# Patient Record
Sex: Male | Born: 1955 | Race: Black or African American | Hispanic: No | Marital: Married | Smoking: Never smoker
Health system: Southern US, Community
[De-identification: ages and names within clinical notes are randomized; demographics above are authoritative.]

## PROBLEM LIST (undated history)

## (undated) DIAGNOSIS — K529 Noninfective gastroenteritis and colitis, unspecified: Secondary | ICD-10-CM

## (undated) HISTORY — PX: CYSTECTOMY: SUR359

## (undated) HISTORY — DX: Noninfective gastroenteritis and colitis, unspecified: K52.9

---

## 2005-12-15 ENCOUNTER — Ambulatory Visit: Payer: Self-pay | Admitting: Family Medicine

## 2005-12-15 LAB — CONVERTED CEMR LAB
Glucose, Bld: 91 mg/dL (ref 70–99)
HCT: 46.3 % (ref 39.0–52.0)
MCV: 83.5 fL (ref 78.0–100.0)
RDW: 13.3 % (ref 11.5–14.6)
Triglyceride fasting, serum: 46 mg/dL (ref 0–149)
VLDL: 9 mg/dL (ref 0–40)
WBC: 5.1 10*3/uL (ref 4.5–10.5)

## 2006-01-04 ENCOUNTER — Ambulatory Visit: Payer: Self-pay | Admitting: Gastroenterology

## 2006-01-19 ENCOUNTER — Encounter (INDEPENDENT_AMBULATORY_CARE_PROVIDER_SITE_OTHER): Payer: Self-pay | Admitting: *Deleted

## 2006-01-19 ENCOUNTER — Ambulatory Visit: Payer: Self-pay | Admitting: Gastroenterology

## 2006-02-16 ENCOUNTER — Ambulatory Visit: Payer: Self-pay | Admitting: Gastroenterology

## 2007-03-28 ENCOUNTER — Ambulatory Visit: Payer: Self-pay | Admitting: Family Medicine

## 2007-03-28 DIAGNOSIS — I10 Essential (primary) hypertension: Secondary | ICD-10-CM | POA: Insufficient documentation

## 2007-03-28 DIAGNOSIS — K5190 Ulcerative colitis, unspecified, without complications: Secondary | ICD-10-CM | POA: Insufficient documentation

## 2007-03-28 DIAGNOSIS — K5289 Other specified noninfective gastroenteritis and colitis: Secondary | ICD-10-CM | POA: Insufficient documentation

## 2007-03-29 ENCOUNTER — Encounter (INDEPENDENT_AMBULATORY_CARE_PROVIDER_SITE_OTHER): Payer: Self-pay | Admitting: *Deleted

## 2007-03-29 LAB — CONVERTED CEMR LAB
Cholesterol: 159 mg/dL (ref 0–200)
HDL: 49.5 mg/dL (ref >39.0)
LDL Cholesterol: 100 mg/dL — ABNORMAL HIGH (ref 0–99)
Triglycerides: 46 mg/dL (ref 0–149)

## 2010-05-22 ENCOUNTER — Encounter: Payer: Self-pay | Admitting: Internal Medicine

## 2010-05-23 ENCOUNTER — Ambulatory Visit (INDEPENDENT_AMBULATORY_CARE_PROVIDER_SITE_OTHER): Payer: BC Managed Care – PPO | Admitting: Internal Medicine

## 2010-05-23 ENCOUNTER — Encounter: Payer: Self-pay | Admitting: Internal Medicine

## 2010-05-23 VITALS — BP 122/86 | HR 50 | Ht 72.5 in | Wt 300.8 lb

## 2010-05-23 DIAGNOSIS — H409 Unspecified glaucoma: Secondary | ICD-10-CM

## 2010-05-23 DIAGNOSIS — Z0000 Encounter for general adult medical examination without abnormal findings: Secondary | ICD-10-CM

## 2010-05-23 DIAGNOSIS — K5289 Other specified noninfective gastroenteritis and colitis: Secondary | ICD-10-CM

## 2010-05-23 DIAGNOSIS — Z136 Encounter for screening for cardiovascular disorders: Secondary | ICD-10-CM

## 2010-05-23 DIAGNOSIS — K529 Noninfective gastroenteritis and colitis, unspecified: Secondary | ICD-10-CM

## 2010-05-23 LAB — BASIC METABOLIC PANEL
BUN: 10 mg/dL (ref 6–23)
CO2: 28 mEq/L (ref 19–32)
Calcium: 9.1 mg/dL (ref 8.4–10.5)
Creatinine, Ser: 0.9 mg/dL (ref 0.4–1.5)
Glucose, Bld: 75 mg/dL (ref 70–99)

## 2010-05-23 LAB — CBC WITH DIFFERENTIAL/PLATELET
Basophils Absolute: 0 10*3/uL (ref 0.0–0.1)
Eosinophils Absolute: 0.4 10*3/uL (ref 0.0–0.7)
HCT: 44 % (ref 39.0–52.0)
Hemoglobin: 14.4 g/dL (ref 13.0–17.0)
Lymphs Abs: 2.7 10*3/uL (ref 0.7–4.0)
MCHC: 32.8 g/dL (ref 30.0–36.0)
Monocytes Absolute: 0.5 10*3/uL (ref 0.1–1.0)
Neutro Abs: 3.5 10*3/uL (ref 1.4–7.7)
RDW: 14.9 % — ABNORMAL HIGH (ref 11.5–14.6)

## 2010-05-23 LAB — AST: AST: 23 U/L (ref 0–37)

## 2010-05-23 LAB — LIPID PANEL
Cholesterol: 139 mg/dL (ref 0–200)
Triglycerides: 34 mg/dL (ref 0.0–149.0)

## 2010-05-23 LAB — ALT: ALT: 21 U/L (ref 0–53)

## 2010-05-23 NOTE — Progress Notes (Signed)
  Subjective:    Patient ID: Mitchell Thornton, male    DOB: 14-Dec-1955, 55 y.o.   MRN: [id number]  HPI NEW PT, CPX Doing well Past Medical History  Diagnosis Date  . Colitis     Cscope ~ 2007, L sided, Rx lialda  . Glaucoma    Past Surgical History  Procedure Date  . Cystectomy     from leg    Family History  Problem Relation Age of Onset  . Coronary artery disease Neg Hx   . Diabetes      M  . Stroke Neg Hx   . Deep vein thrombosis      "clots" mother   . Hypertension      M  . Prostate cancer  40    F  . Colon cancer Neg Hx     History   Social History  . Marital Status: Married    Spouse Name: N/A    Number of Children: 3  . 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 Smoking status: Never Smoker   . Smokeless tobacco: Not on file  . Alcohol Use: No  . Drug Use: No  . Sexually Active: Not on file   Other Topics Concern  . Not on file   Social History Narrative   Original from [place].Marland KitchenMarland KitchenMarland KitchenRegular Exercise- yes, treadmil .... Trying to watch diet...     Review of Systems  Constitutional: Negative for fever and unexpected weight change.  Respiratory: Negative for cough and shortness of breath.   Cardiovascular: Negative for chest pain, palpitations and leg swelling.  Gastrointestinal: Negative for nausea, vomiting, abdominal pain, diarrhea and blood in stool.  Genitourinary: Negative for dysuria, urgency, hematuria and difficulty urinating.       Objective:   Physical Exam  Constitutional: He appears well-developed and well-nourished. No distress.  HENT:  Head: Normocephalic and atraumatic.  Eyes: No scleral icterus.  Neck: Normal range of motion. Neck supple. No thyromegaly present.       Carotid pulses normal  Cardiovascular: Normal rate, regular rhythm and normal heart sounds.   No murmur heard. Pulmonary/Chest: Effort normal and breath sounds normal. No respiratory distress.  He has no wheezes. He has no rales.  Abdominal: Soft. Bowel sounds are normal. He exhibits no distension. There is no tenderness. There is no rebound and no guarding.  Genitourinary:       Has a small external hemorrhoid with no bleeding. Digital rectal exam is normal. Very hard to reach his prostate due to patient habitus. Prostate however seems normal. No stools found.          Assessment & Plan:

## 2010-05-23 NOTE — Assessment & Plan Note (Addendum)
According to the computer, last colonoscopy was in 2009, I can't find a report. He is essentially asymptomatic. I will send a message to GI asking them when his next followup colonoscopy is due .

## 2010-05-24 NOTE — Assessment & Plan Note (Addendum)
Reports h/o glaucoma, uses tetrahydrozoline? Will clarify w/ pharmacy

## 2010-05-24 NOTE — Assessment & Plan Note (Signed)
UTD on TD Diet, exercise discussed Labs  EKG sinus brady, asx , observation

## 2010-05-26 ENCOUNTER — Telehealth: Payer: Self-pay | Admitting: *Deleted

## 2010-05-26 NOTE — Telephone Encounter (Signed)
Message copied by Army Fossa on Mon May 26, 2010  4:06 PM ------      Message from: Willow Ora      Created: Sat May 24, 2010  5:37 PM       Call his pharmacy, does he use tetrahydrozoline eye drops ? (that is not for glaucoma)      Is he allergic to anything?

## 2010-05-26 NOTE — Telephone Encounter (Signed)
I updated med list with correct eye drop that the pharmacist gave me. They also do not have any Allergies on file.

## 2010-05-27 ENCOUNTER — Telehealth: Payer: Self-pay | Admitting: *Deleted

## 2010-05-27 NOTE — Telephone Encounter (Signed)
Message left for patient to return my call.  

## 2010-05-27 NOTE — Telephone Encounter (Signed)
Message copied by Army Fossa on Tue May 27, 2010  9:48 AM ------      Message from: Mitchell Thornton      Created: Mon May 26, 2010  5:17 PM       Advise patient      His cholesterol is very good      His liver, thyroid and PSA test are all normal      Very good results

## 2010-05-28 NOTE — Telephone Encounter (Signed)
Message left for patient to return my call.  

## 2010-05-30 ENCOUNTER — Encounter: Payer: Self-pay | Admitting: Gastroenterology

## 2010-05-30 ENCOUNTER — Telehealth: Payer: Self-pay | Admitting: *Deleted

## 2010-05-30 NOTE — Telephone Encounter (Signed)
Message left for patient to return my call.  

## 2010-05-30 NOTE — Telephone Encounter (Signed)
Consult for Colonoscopy per Dr Arlyce Dice

## 2010-06-02 ENCOUNTER — Encounter: Payer: Self-pay | Admitting: *Deleted

## 2010-06-02 NOTE — Telephone Encounter (Signed)
Mailed letter to pt

## 2010-06-20 NOTE — Assessment & Plan Note (Signed)
Mitchell Thornton                         GASTROENTEROLOGY OFFICE NOTE   Mitchell Thornton, Mitchell Thornton                      MRN:          [id number]  DATE:01/04/2006                            DOB:          28-May-1955    PROBLEM:  Rectal bleeding.   Mitchell Thornton is a pleasant 55 year old male referred through the  courtesy of Dr. Blossom Hoops for evaluation.  For years, he has noted  intermittent rectal bleeding, consisting of bright red blood on the  toilet tissue.  This is associated with constipation and rectal  discomfort, which he attributes to hemorrhoids.  He tends to be  constipated.  Sigmoidoscopy was done a couple of years ago because of  rectal bleeding, and apparently hemorrhoids only were seen.   PAST MEDICAL HISTORY:  Unremarkable.   FAMILY HISTORY:  Pertinent for prostate cancer.   He is on no medications.  He has no allergies.   He neither smokes nor drinks.  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   REVIEW OF SYSTEMS:  Reviewed and was negative.   EXAM:  Pulse 70, blood pressure 138/70, weight 290.  HEENT:  EOMI. PERRLA. Sclerae are anicteric.  Conjunctivae are pink.  NECK:  Supple without thyromegaly, adenopathy or carotid bruits.  CHEST:  Clear to auscultation and percussion without adventitious  sounds.  CARDIAC:  Regular rhythm; normal S1 S2.  There are no murmurs, gallops  or rubs.  ABDOMEN:  Bowel sounds are normoactive.  Abdomen is soft, non-tender and  non-distended.  There are no abdominal masses, tenderness, splenic  enlargement or hepatomegaly.  EXTREMITIES:  Full range of motion.  No cyanosis, clubbing or edema.  RECTAL:  He has small rectal skin tags.  Hemoccult not done.  There are  no masses.  Stool is Hemoccult negative.   IMPRESSION:  1. Limited rectal bleeding - probably secondary to hemorrhoids.  2. Idiopathic constipation.   RECOMMENDATIONS:  1. Fiber supplementation.  2. Screening colonoscopy.  3. Medical  therapy for hemorrhoids if symptoms persist despite fiber.     Barbette Hair. Arlyce Dice, MD,FACG  Electronically Signed    RDK/MedQ  DD: 01/04/2006  DT: 01/04/2006  Job #: [id number]   cc:   Leanne Chang, M.D.

## 2010-06-20 NOTE — Letter (Signed)
January 04, 2006    Dwyane Dupree  [address]  [place], [place]  [address]   RE:  STANLEE, ROEHRIG  MRN:  [id number]  /  DOB:  [birth year]   Dear Mr. Aundria Rud:   It is my pleasure to have treated you recently as a new patient in my  office.  I appreciate your confidence and the opportunity to participate  in your care.   Since I do have a busy inpatient endoscopy schedule and office schedule,  my office hours vary weekly.  I am, however, available for emergency  calls every day through my office.  If I cannot promptly meet an urgent  office appointment, another one of our gastroenterologists will be able  to assist you.   My well-trained staff are prepared to help you at all times.  For  emergencies after office hours, a physician from our gastroenterology  section is always available through my 24-hour answering service.   While you are under my care, I encourage discussion of your questions  and concerns, and I will be happy to return your calls as soon as I am  available.   Once again, I welcome you as a new patient and I look forward to a happy  and healthy relationship.    Sincerely,      Barbette Hair. Arlyce Dice, MD,FACG  Electronically Signed    RDK/MedQ  DD: 01/04/2006  DT: 01/04/2006  Job #: [id number]

## 2010-06-20 NOTE — Assessment & Plan Note (Signed)
[facility]                         GASTROENTEROLOGY OFFICE NOTE   SAVIEN, MAMULA                      MRN:          [id number]  DATE:02/16/2006                            DOB:          1955/02/26    PROBLEM:  Left sided colitis is the reason Mr. Navarette has returned  for scheduled gastrointestinal follow up.  Left sided colitis was noted  at colonoscopy indicated for rectal bleeding.  Since starting Lialda he  has had no further bleeding.  He is without diarrhea.   On exam:  Pulse 60, blood pressure 120/60, weight 301.   IMPRESSION:  Left sided colitis status in remission.   RECOMMENDATIONS:  The patient will consider enrollment in a Insulin  Potentiation Therapy trial.  Feeling that he will be maintained on  Lialda 2.4 grams a day.     Barbette Hair. Arlyce Dice, MD,FACG  Electronically Signed    RDK/MedQ  DD: 02/16/2006  DT: 02/16/2006  Job #: [id number]

## 2010-06-20 NOTE — Letter (Signed)
January 04, 2006    Leanne Chang, M.D.  [facility]  [place], [place]  [address]   RE:  JERONE, CUDMORE  MRN:  [id number]  /  DOB:  1956/01/01   Dear Dr. Blossom Hoops:   Upon your kind referral, I had the pleasure of evaluating your patient  and I am pleased to offer my findings.  I saw Lakyn Mantione in the  office today.  Enclosed is a copy of my progress note that details my  findings and recommendations.   Thank you for the opportunity to participate in your patient's care.    Sincerely,      Barbette Hair. Arlyce Dice, MD,FACG  Electronically Signed    RDK/MedQ  DD: 01/04/2006  DT: 01/04/2006  Job #: [id number]

## 2010-06-26 ENCOUNTER — Ambulatory Visit: Payer: BC Managed Care – PPO | Admitting: Gastroenterology

## 2012-05-31 ENCOUNTER — Ambulatory Visit (INDEPENDENT_AMBULATORY_CARE_PROVIDER_SITE_OTHER): Payer: BC Managed Care – PPO | Admitting: Internal Medicine

## 2012-05-31 ENCOUNTER — Encounter: Payer: Self-pay | Admitting: Internal Medicine

## 2012-05-31 VITALS — BP 140/80 | HR 56 | Temp 98.2°F | Ht 72.0 in | Wt 304.0 lb

## 2012-05-31 DIAGNOSIS — Z0000 Encounter for general adult medical examination without abnormal findings: Secondary | ICD-10-CM

## 2012-05-31 LAB — CBC WITH DIFFERENTIAL/PLATELET
Basophils Relative: 0.6 % (ref 0.0–3.0)
Eosinophils Absolute: 0.2 10*3/uL (ref 0.0–0.7)
HCT: 43.9 % (ref 39.0–52.0)
Hemoglobin: 14.5 g/dL (ref 13.0–17.0)
Lymphocytes Relative: 34.7 % (ref 12.0–46.0)
Lymphs Abs: 2.9 10*3/uL (ref 0.7–4.0)
MCHC: 33 g/dL (ref 30.0–36.0)
Neutro Abs: 4.5 10*3/uL (ref 1.4–7.7)
RBC: 5.51 Mil/uL (ref 4.22–5.81)

## 2012-05-31 LAB — COMPREHENSIVE METABOLIC PANEL
AST: 25 U/L (ref 0–37)
Alkaline Phosphatase: 85 U/L (ref 39–117)
BUN: 14 mg/dL (ref 6–23)
Calcium: 9 mg/dL (ref 8.4–10.5)
Chloride: 105 mEq/L (ref 96–112)
Creatinine, Ser: 0.9 mg/dL (ref 0.4–1.5)
Total Bilirubin: 0.7 mg/dL (ref 0.3–1.2)

## 2012-05-31 LAB — PSA: PSA: 0.82 ng/mL (ref 0.10–4.00)

## 2012-05-31 LAB — LIPID PANEL: VLDL: 7.6 mg/dL (ref 0.0–40.0)

## 2012-05-31 NOTE — Progress Notes (Signed)
  Subjective:    Patient ID: Mitchell Thornton, male    DOB: [birth year], 57 y.o.   MRN: [id number]  HPI  CPX  Past Medical History  Diagnosis Date  . Colitis     Cscope ~ 2007, L sided, Rx lialda  . Glaucoma(365)    Past Surgical History  Procedure Laterality Date  . Cystectomy      from leg    History   Social History  . Marital Status: Married    Spouse Name: N/A    Number of Children: 3  . 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 Smoking status: Never Smoker   . Smokeless tobacco: Never Used  . Alcohol Use: No  . Drug Use: No  . Sexually Active: Not on file   Other Topics Concern  . Not on file   Social History Narrative   Original from [place], moved to the [place] in 1982, moved to [place] ~ 1995                  Family History  Problem Relation Age of Onset  . Coronary artery disease Neg Hx   . Diabetes Mother   . Stroke Neg Hx   . Deep vein thrombosis Mother     "clots" mother   . Hypertension Mother   . Prostate cancer Father 76  . Colon cancer Neg Hx   . Aneurysm Father     brain aneurysm    Review of Systems  In general feeling well. No chest pain, shortness or breath. no nausea, vomiting, diarrhea or blood in the stools. No dysuria, blood in the urine or difficulty urinating. No anxiety depression. He admits to snoring, denies feeling sleepy, in the mornings he feels well rested.    Objective:   Physical Exam BP 140/80  Pulse 56  Temp(Src) 98.2 F (36.8 C) (Oral)  Ht 6' (1.829 m)  Wt 304 lb (137.893 kg)  BMI 41.22 kg/m2  SpO2 99%  General -- alert, well-developed, No apparent distress.   Neck --no thyromegaly Lungs -- normal respiratory effort, no intercostal retractions, no accessory muscle use, and normal breath sounds.   Heart-- normal rate, regular rhythm, no murmur, and no gallop.   Abdomen--soft, non-tender, no distention, no masses, no HSM, no guarding, and no  rigidity.   Extremities-- no pretibial edema bilaterally  DRE: No external abnormalities, no mass, prostate very hard to reach however does not seem to be tender indurated or nodular. no stools found. Neurologic-- alert & oriented X3 and strength normal in all extremities. Psych-- Cognition and judgment appear intact. Alert and cooperative with normal attention span and concentration.  not anxious appearing and not depressed appearing.       Assessment & Plan:

## 2012-05-31 NOTE — Assessment & Plan Note (Signed)
UTD on TD, 2009 Labs  Last Cscope 9-12, next 2017  Diet, exercise -- not doing well lately but plans to make some changes BP 140/80, rec self monitoring He snores but otherwise not tired or sleepy, rec wt loss (BMI 41)

## 2012-05-31 NOTE — Patient Instructions (Addendum)
Check the  blood pressure 2 or 3 times a week, be sure it is between 110/60 and 140/85. If it is consistently higher or lower, let me know  

## 2012-06-01 ENCOUNTER — Encounter: Payer: Self-pay | Admitting: Internal Medicine

## 2012-06-06 ENCOUNTER — Encounter: Payer: Self-pay | Admitting: *Deleted

## 2013-03-13 ENCOUNTER — Ambulatory Visit (INDEPENDENT_AMBULATORY_CARE_PROVIDER_SITE_OTHER): Payer: BC Managed Care – PPO | Admitting: Internal Medicine

## 2013-03-13 ENCOUNTER — Encounter: Payer: Self-pay | Admitting: Internal Medicine

## 2013-03-13 VITALS — BP 171/90 | HR 61 | Temp 98.6°F | Wt 315.0 lb

## 2013-03-13 DIAGNOSIS — N318 Other neuromuscular dysfunction of bladder: Secondary | ICD-10-CM

## 2013-03-13 DIAGNOSIS — M79644 Pain in right finger(s): Secondary | ICD-10-CM

## 2013-03-13 DIAGNOSIS — M79609 Pain in unspecified limb: Secondary | ICD-10-CM

## 2013-03-13 DIAGNOSIS — N3281 Overactive bladder: Secondary | ICD-10-CM

## 2013-03-13 DIAGNOSIS — R350 Frequency of micturition: Secondary | ICD-10-CM

## 2013-03-13 LAB — POCT URINALYSIS DIPSTICK
BILIRUBIN UA: NEGATIVE
GLUCOSE UA: NEGATIVE
Ketones, UA: NEGATIVE
Leukocytes, UA: NEGATIVE
NITRITE UA: NEGATIVE
Protein, UA: NEGATIVE
RBC UA: NEGATIVE
Spec Grav, UA: 1.01
UROBILINOGEN UA: NEGATIVE
pH, UA: 6

## 2013-03-13 NOTE — Patient Instructions (Signed)
Avoid excessive caffeine Drink plenty of clear fluids during the daytime Avoid excessive fluids after 7 PM Call anytime if  symptoms get worse. Next visit in April for a physical exam

## 2013-03-13 NOTE — Progress Notes (Signed)
   Subjective:    Patient ID: Mitchell Thornton, male    DOB: May 29, 1955, 58 y.o.   MRN: [id number]  DOS:  03/13/2013 Acute visit ,we discussed the following issues  For the last 3 years with cold weather he experiences nocturia, needs to urinate 2 or 3 times at night, no symptoms during the summer. Also complained off the right thumb get locked. + pain. No injury that he can recall.   Past Medical History  Diagnosis Date  . Colitis     Cscope ~ 2007, L sided, Rx lialda  . Glaucoma     Past Surgical History  Procedure Laterality Date  . Cystectomy      from leg     History   Social History  . Marital Status: Married    Spouse Name: N/A    Number of Children: 3  . 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 Smoking status: Never Smoker   . Smokeless tobacco: Never Used  . Alcohol Use: No  . Drug Use: No  . Sexual Activity: Not on file   Other Topics Concern  . Not on file   Social History Narrative   Original from [place], moved to the [place] in [place], moved to [place] ~ 1995                    ROS No f/c  No dysuria, gross hematuria, difficulty urinating    no flank or abdominal pain     Objective:   Physical Exam BP 171/90  Pulse 61  Temp(Src) 98.6 F (37 C)  Wt 315 lb (142.883 kg)  SpO2 99% General -- alert, well-developed, NAD.  Abdomen-- Not distended, good bowel sounds,soft, non-tender. No CVA tenderness  Extremities--  All fingers normal to inspection, the right thumb flexion is quite limited, I did passive flexion and the PIP  did move but I felt that click and pt had pain. Neurologic--  alert & oriented X3. Speech normal, gait normal, strength normal in all extremities.   Psych-- Cognition and judgment appear intact. Cooperative with normal attention span and concentration. No anxious or depressed appearing.       Assessment & Plan:   Over active bladder ?, On and off nocturia  for 2 or 3 years only in the winter, DRE and PSA 05/2012 were stable/normal (DRE not repeated today) No red flag symptoms. Udip is negative. Plan:  Urine culture, we discussed also Flomax The patient not interested at this time the symptoms are mild. He will call me if symptoms increase and we can discuss further next April 90 come back for his physical.  R Thumb pain click: Refer to ortho

## 2013-03-13 NOTE — Progress Notes (Signed)
Pre visit review using our clinic review tool, if applicable. No additional management support is needed unless otherwise documented below in the visit note. 

## 2013-03-14 LAB — URINALYSIS, ROUTINE W REFLEX MICROSCOPIC
BILIRUBIN URINE: NEGATIVE
HGB URINE DIPSTICK: NEGATIVE
Ketones, ur: NEGATIVE
Leukocytes, UA: NEGATIVE
NITRITE: NEGATIVE
PH: 6.5 (ref 5.0–8.0)
Specific Gravity, Urine: 1.015 (ref 1.000–1.030)
TOTAL PROTEIN, URINE-UPE24: NEGATIVE
URINE GLUCOSE: NEGATIVE
Urobilinogen, UA: 0.2 (ref 0.0–1.0)
WBC UA: NONE SEEN — AB (ref 0–?)

## 2013-03-14 LAB — URINE CULTURE
Colony Count: NO GROWTH
Organism ID, Bacteria: NO GROWTH

## 2013-06-02 ENCOUNTER — Telehealth: Payer: Self-pay

## 2013-06-02 NOTE — Telephone Encounter (Signed)
Medication List and allergies:  Reviewed and updated  90 day supply/mail order: na [place] [facility] [address] [place]  Immunizations due: flu vaccine in the fall  A/P:   No changes to FH, PSH or Personal Hx Flu vaccine--12/2012 Tdap--2009 [facility]CS--09/2010--[facility]--next due 2017 PSA--05/2012--0.82  To Discuss with Provider: Not at this time

## 2013-06-05 ENCOUNTER — Encounter: Payer: Self-pay | Admitting: Internal Medicine

## 2013-06-05 ENCOUNTER — Ambulatory Visit (INDEPENDENT_AMBULATORY_CARE_PROVIDER_SITE_OTHER): Payer: BC Managed Care – PPO | Admitting: Internal Medicine

## 2013-06-05 VITALS — BP 173/93 | HR 64 | Temp 98.2°F | Ht 72.1 in | Wt 315.0 lb

## 2013-06-05 DIAGNOSIS — Z0000 Encounter for general adult medical examination without abnormal findings: Secondary | ICD-10-CM

## 2013-06-05 DIAGNOSIS — R030 Elevated blood-pressure reading, without diagnosis of hypertension: Secondary | ICD-10-CM

## 2013-06-05 LAB — TSH: TSH: 0.52 u[IU]/mL (ref 0.35–5.50)

## 2013-06-05 LAB — CBC WITH DIFFERENTIAL/PLATELET
BASOS ABS: 0 10*3/uL (ref 0.0–0.1)
BASOS PCT: 0.4 % (ref 0.0–3.0)
EOS ABS: 0.2 10*3/uL (ref 0.0–0.7)
Eosinophils Relative: 2.7 % (ref 0.0–5.0)
HCT: 43.8 % (ref 39.0–52.0)
Hemoglobin: 14.5 g/dL (ref 13.0–17.0)
LYMPHS PCT: 40.9 % (ref 12.0–46.0)
Lymphs Abs: 2.9 10*3/uL (ref 0.7–4.0)
MCHC: 33.2 g/dL (ref 30.0–36.0)
MCV: 80.6 fl (ref 78.0–100.0)
MONO ABS: 0.5 10*3/uL (ref 0.1–1.0)
Monocytes Relative: 6.6 % (ref 3.0–12.0)
NEUTROS PCT: 49.4 % (ref 43.0–77.0)
Neutro Abs: 3.6 10*3/uL (ref 1.4–7.7)
PLATELETS: 170 10*3/uL (ref 150.0–400.0)
RBC: 5.43 Mil/uL (ref 4.22–5.81)
RDW: 14.6 % (ref 11.5–14.6)
WBC: 7.2 10*3/uL (ref 4.5–10.5)

## 2013-06-05 LAB — COMPREHENSIVE METABOLIC PANEL
ALBUMIN: 3.9 g/dL (ref 3.5–5.2)
ALK PHOS: 70 U/L (ref 39–117)
ALT: 30 U/L (ref 0–53)
AST: 28 U/L (ref 0–37)
BUN: 17 mg/dL (ref 6–23)
CALCIUM: 8.7 mg/dL (ref 8.4–10.5)
CHLORIDE: 105 meq/L (ref 96–112)
CO2: 26 mEq/L (ref 19–32)
Creatinine, Ser: 1 mg/dL (ref 0.4–1.5)
GFR: 101.72 mL/min (ref >60.00)
Glucose, Bld: 98 mg/dL (ref 70–99)
POTASSIUM: 4 meq/L (ref 3.5–5.1)
SODIUM: 137 meq/L (ref 135–145)
TOTAL PROTEIN: 7.4 g/dL (ref 6.0–8.3)
Total Bilirubin: 0.3 mg/dL (ref 0.3–1.2)

## 2013-06-05 LAB — LIPID PANEL
CHOLESTEROL: 140 mg/dL (ref 0–200)
HDL: 41.6 mg/dL (ref >39.00)
LDL CALC: 89 mg/dL (ref 0–99)
TRIGLYCERIDES: 46 mg/dL (ref 0.0–149.0)
Total CHOL/HDL Ratio: 3
VLDL: 9.2 mg/dL (ref 0.0–40.0)

## 2013-06-05 MED ORDER — AMLODIPINE BESYLATE 5 MG PO TABS: 5.0000 mg | ORAL_TABLET | Freq: Every day | ORAL | Status: DC

## 2013-06-05 NOTE — Assessment & Plan Note (Addendum)
In addition to diet and exercise will start amlodipine 5 mg, followup 2 months ekg today brachycardia, at baseline

## 2013-06-05 NOTE — Assessment & Plan Note (Addendum)
Td  2009 Labs  Last Cscope 9-12, next 2017  PSA stable x years, DRE and PSA next year Diet, exercise: Extensive discussion, rec calorie counting and exercise goal of 3 hours a week He snores, occ feels  tired   rec wt loss (BMI 41), consider sleep study

## 2013-06-05 NOTE — Patient Instructions (Signed)
Get your blood work before you leave   Start amlodipine 5 mg one tablet daily Check the  blood pressure 2 or 3 times a  week be sure it is between 110/60 and 140/85. Ideal blood pressure is 120/80. If it is consistently higher or lower, let me know  Exercise 3 hours a week, walking in the treadmill is great Start counting your daily calories intake, may like to use MYFITNESSPAL   Next visit is for routine check up regards your blood pressure and weight in 6-8 weeks. No need to come back fasting Please make an appointment

## 2013-06-05 NOTE — Progress Notes (Signed)
   Subjective:    Patient ID: Mitchell RilesLouis Dakin, male    DOB: August 09, 1955, 58 y.o.   MRN: [id number]  DOS:  06/05/2013 Type of  visit:  CPX Concerned about his weight, he tends brought a treadmill and uses it on and off. He usually has a very light breakfast and then overeats at night. Admits to snoring, from time to time he gets fatigued throughout the day.  ROS   No  CP, SOB No palpitations, no lower extremity edema Denies  nausea, vomiting diarrhea No abdominal pain Denies  blood in the stools  (-) cough, sputum production (-) wheezing, chest congestion  some allergies  No dysuria, gross hematuria, difficulty urinating  No anxiety, depression    Past Medical History  Diagnosis Date  . Colitis     Cscope ~ 2007, L sided, Rx lialda  . Glaucoma     Past Surgical History  Procedure Laterality Date  . Cystectomy      from leg     History   Social History  . Marital Status: Married    Spouse Name: N/A    Number of Children: 3  . 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 Smoking status: Never Smoker   . Smokeless tobacco: Never Used  . Alcohol Use: No  . Drug Use: No  . Sexual Activity: Not on file   Other Topics Concern  . Not on file   Social History Narrative   Original from [place], [place] the [place], moved to [place] ~ 1995                       Medication List       This list is accurate as of: 06/05/13  8:43 AM.  Always use your most recent med list.               latanoprost 0.005 % ophthalmic solution  Commonly known as:  XALATAN  Place 1 drop into both eyes at bedtime.     mesalamine 1.2 G EC tablet  Commonly known as:  LIALDA  Take 2 tablet by mouth every morning.           Objective:   Physical Exam BP 173/93  Pulse 64  Temp(Src) 98.2 F (36.8 C)  Ht 6' 0.1" (1.831 m)  Wt 315 lb (142.883 kg)  BMI 42.62 kg/m2  SpO2 100%  General -- alert,  well-developed, NAD.  Neck --no thyromegaly    Lungs -- normal respiratory effort, no intercostal retractions, no accessory muscle use, and normal breath sounds.  Heart-- normal rate, regular rhythm, no murmur.  Abdomen-- Not distended, good bowel sounds,soft, non-tender.  Extremities-- no pretibial edema bilaterally  Neurologic--  alert & oriented X3. Speech normal, gait normal, strength normal in all extremities.  Psych-- Cognition and judgment appear intact. Cooperative with normal attention span and concentration. No anxious or depressed appearing.         Assessment & Plan:

## 2013-06-05 NOTE — Progress Notes (Signed)
Pre visit review using our clinic review tool, if applicable. No additional management support is needed unless otherwise documented below in the visit note. 

## 2013-06-07 ENCOUNTER — Encounter: Payer: Self-pay | Admitting: *Deleted

## 2013-07-27 ENCOUNTER — Ambulatory Visit: Payer: BC Managed Care – PPO | Admitting: Internal Medicine

## 2013-09-26 ENCOUNTER — Encounter: Payer: Self-pay | Admitting: Medical

## 2013-09-26 ENCOUNTER — Ambulatory Visit (INDEPENDENT_AMBULATORY_CARE_PROVIDER_SITE_OTHER): Payer: BC Managed Care – PPO | Admitting: Medical

## 2013-09-26 VITALS — BP 140/80 | HR 76 | Temp 99.0°F | Wt 300.0 lb

## 2013-09-26 DIAGNOSIS — R197 Diarrhea, unspecified: Secondary | ICD-10-CM

## 2013-09-26 DIAGNOSIS — R109 Unspecified abdominal pain: Secondary | ICD-10-CM | POA: Insufficient documentation

## 2013-09-26 DIAGNOSIS — K5732 Diverticulitis of large intestine without perforation or abscess without bleeding: Secondary | ICD-10-CM

## 2013-09-26 MED ORDER — METRONIDAZOLE 500 MG PO TABS: 500.0000 mg | ORAL_TABLET | Freq: Three times a day (TID) | ORAL | Status: DC

## 2013-09-26 MED ORDER — CIPROFLOXACIN HCL 500 MG PO TABS: 500.0000 mg | ORAL_TABLET | Freq: Two times a day (BID) | ORAL | Status: DC

## 2013-09-26 NOTE — Progress Notes (Signed)
Pre visit review using our clinic review tool, if applicable. No additional management support is needed unless otherwise documented below in the visit note. 

## 2013-09-26 NOTE — Progress Notes (Signed)
   Subjective:    Patient ID: Mitchell Thornton, male    DOB: [birth year], 58 y.o.   MRN: [id number]  HPI   Pt in with feeling bloated and passing gas. Pt last 3 days loose stools and gas. Pt states about 5 loose stools per day past 2 days. Pt had known diverticulosis and one time took antibiotics for diverticultis, He did see some bright red blood in his stool on Saturday. Sunday felt some chills. No fever. Pt states bright red stool occurred after five very water diarrhea. Patient thinks that the diarrhea may his rectum a little raw. The causing bright red blood.  No family members sick with diarrhea. No association with anything he ate.   Review of Systems  Constitutional: Positive for chills. Negative for fever and fatigue.       Chills on Sunday  Respiratory: Negative for cough, choking and wheezing.   Cardiovascular: Negative for chest pain and palpitations.  Gastrointestinal: Positive for abdominal pain, diarrhea, blood in stool and abdominal distention. Negative for nausea, vomiting, constipation, anal bleeding and rectal pain.       One stool that appeared to have some bright red blood. This was after 5 watery stools that he thinks irritated his rectum. No blood seen since.  Genitourinary: Negative.   Musculoskeletal: Negative for back pain, gait problem, myalgias, neck pain and neck stiffness.  Neurological: Negative.        Objective:   Physical Exam  General Appearance- Not in acute distress.  HEENT Eyes- Scleraeral/Conjuntiva-bilat- Not Yellow. Mouth & Throat- Normal.  Chest and Lung Exam Auscultation: Breath sounds:-Normal. Adventitious sounds:- No Adventitious sounds.  Cardiovascular Auscultation:Rythm - Regular. Heart Sounds -Normal heart sounds.  Abdomen Inspection:-Inspection Normal.  Palpation/Perucssion: Palpation- Non Tender in all quadrants(particularly the left lower quadrant), No Rebound tenderness, No rigidity(Guarding) and No Palpable abdominal  masses.  Liver:-Normal.  Spleen:- Normal.   Rectal Anorectal Exam: Stool - Hemoccult of stool/mucous is Heme Negative. External - normal external exam. Internal - normal sphincter tone. No rectal mass.        Assessment & Plan:

## 2013-09-26 NOTE — Assessment & Plan Note (Signed)
We'll go ahead and prescribe ciprofloxacin and Flagyl for 10 days. Patient instructed to do stool panel studies tonight and turned them in tomorrow a.m. There start antibiotics.

## 2013-09-26 NOTE — Assessment & Plan Note (Signed)
Likely associated with the diverticulitis but I do want him to do a stool panel prior to starting antibiotics tomorrow.

## 2013-09-26 NOTE — Assessment & Plan Note (Signed)
Some recent leg but on exam today he had no pain. I did advise him that if his abdominal pain recurs with any blood then to notify us since in that case I would likely order an abdomen/pelvis CT. Any severe pain after hours did advise patient to go to the emergency department.

## 2013-09-26 NOTE — Patient Instructions (Signed)
For your diarrhea for 3 days I want you to turn in stool panel kit by tomorrow am. Then start antibiotics(cipro and flagyl) I sent to your pharmacy for possible diverticulitis. Get cbc/lab today. If your abdominal pain worsens or blood stools notify me and would order ct of abdomen/pelvis. Follow up in 7 days or as needed. Any severe abdomen pain after hours then ED evaluation.

## 2013-09-27 LAB — CBC WITH DIFFERENTIAL/PLATELET
BASOS ABS: 0 10*3/uL (ref 0.0–0.1)
Basophils Relative: 0.2 % (ref 0.0–3.0)
EOS PCT: 5.1 % — AB (ref 0.0–5.0)
Eosinophils Absolute: 0.5 10*3/uL (ref 0.0–0.7)
HEMATOCRIT: 41.9 % (ref 39.0–52.0)
Hemoglobin: 13.6 g/dL (ref 13.0–17.0)
LYMPHS ABS: 2.5 10*3/uL (ref 0.7–4.0)
LYMPHS PCT: 24.4 % (ref 12.0–46.0)
MCHC: 32.5 g/dL (ref 30.0–36.0)
MCV: 81.4 fl (ref 78.0–100.0)
MONOS PCT: 7.8 % (ref 3.0–12.0)
Monocytes Absolute: 0.8 10*3/uL (ref 0.1–1.0)
Neutro Abs: 6.4 10*3/uL (ref 1.4–7.7)
Neutrophils Relative %: 62.5 % (ref 43.0–77.0)
Platelets: 165 10*3/uL (ref 150.0–400.0)
RBC: 5.15 Mil/uL (ref 4.22–5.81)
RDW: 14.6 % (ref 11.5–15.5)
WBC: 10.3 10*3/uL (ref 4.0–10.5)

## 2013-09-29 LAB — OVA AND PARASITE EXAMINATION: OP: NONE SEEN

## 2013-09-29 LAB — CLOSTRIDIUM DIFFICILE BY PCR: Toxigenic C. Difficile by PCR: NOT DETECTED

## 2013-10-01 LAB — STOOL CULTURE

## 2013-10-03 ENCOUNTER — Telehealth: Payer: Self-pay | Admitting: Medical

## 2013-10-03 ENCOUNTER — Telehealth: Payer: Self-pay

## 2013-10-03 DIAGNOSIS — K5732 Diverticulitis of large intestine without perforation or abscess without bleeding: Secondary | ICD-10-CM

## 2013-10-03 NOTE — Telephone Encounter (Signed)
Message copied by Lurline Hare on Tue Oct 03, 2013  8:38 AM ------      Message from: Gwenevere Abbot      Created: Tue Oct 03, 2013  6:14 AM       Please call pt and see how he is doing. Let him know we are trying to get him in with GI this week. Is he having a lot of blood with stools. Is he feeling real weak. Let me know in that event want to repeat cbc and maybe do imaging studies. Trying to get him in with gi by Thursday or Friday of this week. ------

## 2013-10-03 NOTE — Telephone Encounter (Signed)
Will refer to GI. For probable diverticulitis with some improvement but reported still blood in stools.

## 2013-10-19 ENCOUNTER — Telehealth: Payer: Self-pay | Admitting: Medical

## 2013-10-19 NOTE — Telephone Encounter (Signed)
I called pt to see how he is ? He was put on Lialda for ulcerative colitis. I wanted to see how he was doing and make sure he would follow up with GI. I left message on his machine.

## 2013-12-30 ENCOUNTER — Other Ambulatory Visit: Payer: Self-pay | Admitting: Internal Medicine

## 2014-01-29 ENCOUNTER — Ambulatory Visit (INDEPENDENT_AMBULATORY_CARE_PROVIDER_SITE_OTHER): Payer: BC Managed Care – PPO | Admitting: Internal Medicine

## 2014-01-29 ENCOUNTER — Encounter: Payer: Self-pay | Admitting: Internal Medicine

## 2014-01-29 VITALS — BP 165/81 | HR 73 | Temp 98.4°F | Ht 72.0 in | Wt 311.4 lb

## 2014-01-29 DIAGNOSIS — R109 Unspecified abdominal pain: Secondary | ICD-10-CM

## 2014-01-29 DIAGNOSIS — I10 Essential (primary) hypertension: Secondary | ICD-10-CM

## 2014-01-29 MED ORDER — AMLODIPINE BESYLATE 5 MG PO TABS: 5.0000 mg | ORAL_TABLET | Freq: Every day | ORAL | Status: DC

## 2014-01-29 NOTE — Progress Notes (Signed)
Pre visit review using our clinic review tool, if applicable. No additional management support is needed unless otherwise documented below in the visit note. 

## 2014-01-29 NOTE — Patient Instructions (Signed)
exercise 3 hours a week  Try  to improve your diet  Come back by May 2016 fasting for a physical exam   If you need more information about a healthy diet  visit  the American Heart Association, it  is a Radiographer, therapeuticgreat resource online at:  [email or web address]/[email or web address]

## 2014-01-29 NOTE — Assessment & Plan Note (Signed)
Was seen with abdominal pain, treated empirically for diverticulitis, he improved. Subsequently he saw a GI doctor @  [facility], will try to obtain records

## 2014-01-29 NOTE — Assessment & Plan Note (Addendum)
Patient currently taking amlodipine 5 mg daily, BP today slightly elevated, no ambulatory BPs. We talk about adjusting his medication but patient would like to try lifestyle modification first. Plan: Continue amlodipine 5 mg, work on diet and exercise, information provided. Check BPs at home, goals discussed

## 2014-01-29 NOTE — Progress Notes (Signed)
   Subjective:    Patient ID: Audry RilesLouis Colarusso, male    DOB: Sep 12, 1955, 58 y.o.   MRN: [id number]  DOS:  01/29/2014 Type of visit - description : rov Interval history: Hypertension, good medication compliance, not ambulatory BPs, BP today is elevated. Was seen 09-2013 with abdominal pain, felt to be diverticulitis, took antibiotics,  gradually improved. Later on he saw a GI doctor.  ROS Denies chest pain, difficulty breathing, no lower extremity edema No headache or dizziness Not doing well with diet or exercise, has gained 11 pounds since the last visit  Past Medical History  Diagnosis Date  . Colitis     Cscope ~ 2007, L sided, Rx lialda  . Glaucoma     Past Surgical History  Procedure Laterality Date  . Cystectomy      from leg     History   Social History  . Marital Status: Married    Spouse Name: N/A    Number of Children: 3  . 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 Smoking status: Never Smoker   . Smokeless tobacco: Never Used  . Alcohol Use: No  . Drug Use: No  . Sexual Activity: Not on file   Other Topics Concern  . Not on file   Social History Narrative   Original from [place], moved to the [place] in [place], moved to [place] ~ 1995                       Medication List       This list is accurate as of: 01/29/14 11:59 PM.  Always use your most recent med list.               amLODipine 5 MG tablet  Commonly known as:  NORVASC  Take 1 tablet (5 mg total) by mouth daily.     latanoprost 0.005 % ophthalmic solution  Commonly known as:  XALATAN  Place 1 drop into both eyes at bedtime.     mesalamine 1.2 G EC tablet  Commonly known as:  LIALDA  Take 2 tablet by mouth every morning.           Objective:   Physical Exam BP 165/81 mmHg  Pulse 73  Temp(Src) 98.4 F (36.9 C) (Oral)  Ht 6' (1.829 m)  Wt 311 lb 6 oz (141.239 kg)  BMI 42.22 kg/m2  SpO2 97% General --  alert, well-developed, NAD.   Lungs -- normal respiratory effort, no intercostal retractions, no accessory muscle use, and normal breath sounds.  Heart-- normal rate, regular rhythm, no murmur.  Abdomen-- Not distended, good bowel sounds,soft, non-tender. Extremities-- no pretibial edema bilaterally  Neurologic--  alert & oriented X3. Speech normal, gait appropriate for age, strength symmetric and appropriate for age. Psych-- Cognition and judgment appear intact. Cooperative with normal attention span and concentration. No anxious or depressed appearing.      Assessment & Plan:

## 2014-01-30 ENCOUNTER — Telehealth: Payer: Self-pay | Admitting: Medical

## 2014-01-31 NOTE — Telephone Encounter (Signed)
Have you seen any OV notes from Dr. Marcelene ButteHurrelbrink at [facility]?

## 2014-01-31 NOTE — Telephone Encounter (Signed)
I have not seen these, will be out the lookout for them. JG//CMA

## 2014-02-08 NOTE — Telephone Encounter (Signed)
Received OV notes from [facility], Dr. Marcelene ButteHurrelbrink. Placed in Dr. Leta JunglingPaz's folder to be reviewed.

## 2014-02-08 NOTE — Telephone Encounter (Signed)
Spoke with [facility] again, requested OV notes from visit with Dr. Marcelene ButteHurrelbrink, informed they would fax them to us right away.

## 2014-02-09 NOTE — Telephone Encounter (Addendum)
Notes reviewed, [facility] was [facility], they felt that the patient possibly had ulcerative colitis and start he on 5-ASA. On chart review, he had a colonoscopy in 2012 by Dr. Matthias HughsBuccini, biopsy show IBD , inflammatory bowel disease. He was recommended to have another colonoscopy in 2017. Will discuss this with the patient when he comes back

## 2014-05-27 ENCOUNTER — Encounter (HOSPITAL_BASED_OUTPATIENT_CLINIC_OR_DEPARTMENT_OTHER): Payer: Self-pay | Admitting: *Deleted

## 2014-05-27 ENCOUNTER — Emergency Department (HOSPITAL_BASED_OUTPATIENT_CLINIC_OR_DEPARTMENT_OTHER)
Admission: EM | Admit: 2014-05-27 | Discharge: 2014-05-28 | Disposition: A | Discharge status: Home / Self Care | Payer: BLUE CROSS/BLUE SHIELD | Attending: Emergency Medicine | Admitting: Emergency Medicine

## 2014-05-27 ENCOUNTER — Emergency Department (HOSPITAL_BASED_OUTPATIENT_CLINIC_OR_DEPARTMENT_OTHER)
Admit: 2014-05-27 | Discharge: 2014-05-27 | Disposition: A | Discharge status: Home / Self Care | Payer: BLUE CROSS/BLUE SHIELD | Attending: Emergency Medicine | Admitting: Emergency Medicine

## 2014-05-27 DIAGNOSIS — I82402 Acute embolism and thrombosis of unspecified deep veins of left lower extremity: Secondary | ICD-10-CM

## 2014-05-27 DIAGNOSIS — I82492 Acute embolism and thrombosis of other specified deep vein of left lower extremity: Secondary | ICD-10-CM | POA: Diagnosis not present

## 2014-05-27 DIAGNOSIS — H409 Unspecified glaucoma: Secondary | ICD-10-CM | POA: Insufficient documentation

## 2014-05-27 DIAGNOSIS — K529 Noninfective gastroenteritis and colitis, unspecified: Secondary | ICD-10-CM | POA: Diagnosis not present

## 2014-05-27 DIAGNOSIS — M7989 Other specified soft tissue disorders: Secondary | ICD-10-CM

## 2014-05-27 DIAGNOSIS — Z79899 Other long term (current) drug therapy: Secondary | ICD-10-CM | POA: Insufficient documentation

## 2014-05-27 DIAGNOSIS — R2242 Localized swelling, mass and lump, left lower limb: Secondary | ICD-10-CM | POA: Diagnosis present

## 2014-05-27 LAB — CBC WITH DIFFERENTIAL/PLATELET
BASOS ABS: 0 10*3/uL (ref 0.0–0.1)
Basophils Relative: 0 % (ref 0–1)
Eosinophils Absolute: 0.2 10*3/uL (ref 0.0–0.7)
Eosinophils Relative: 3 % (ref 0–5)
HEMATOCRIT: 42.1 % (ref 39.0–52.0)
Hemoglobin: 13.6 g/dL (ref 13.0–17.0)
Lymphocytes Relative: 42 % (ref 12–46)
Lymphs Abs: 2.8 10*3/uL (ref 0.7–4.0)
MCH: 25.6 pg — ABNORMAL LOW (ref 26.0–34.0)
MCHC: 32.3 g/dL (ref 30.0–36.0)
MCV: 79.1 fL (ref 78.0–100.0)
MONO ABS: 0.6 10*3/uL (ref 0.1–1.0)
Monocytes Relative: 9 % (ref 3–12)
Neutro Abs: 3.1 10*3/uL (ref 1.7–7.7)
Neutrophils Relative %: 46 % (ref 43–77)
PLATELETS: 165 10*3/uL (ref 150–400)
RBC: 5.32 MIL/uL (ref 4.22–5.81)
RDW: 15.5 % (ref 11.5–15.5)
WBC: 6.7 10*3/uL (ref 4.0–10.5)

## 2014-05-27 LAB — BASIC METABOLIC PANEL
Anion gap: 6 (ref 5–15)
BUN: 14 mg/dL (ref 6–23)
CHLORIDE: 107 mmol/L (ref 96–112)
CO2: 24 mmol/L (ref 19–32)
CREATININE: 0.89 mg/dL (ref 0.50–1.35)
Calcium: 8.5 mg/dL (ref 8.4–10.5)
GFR calc Af Amer: >90 mL/min (ref >90)
Glucose, Bld: 122 mg/dL — ABNORMAL HIGH (ref 70–99)
Potassium: 3.7 mmol/L (ref 3.5–5.1)
SODIUM: 137 mmol/L (ref 135–145)

## 2014-05-27 LAB — D-DIMER, QUANTITATIVE (NOT AT ARMC): D-Dimer, Quant: 0.78 ug/mL-FEU — ABNORMAL HIGH (ref 0.00–0.48)

## 2014-05-27 NOTE — ED Notes (Signed)
Pt with 2 weeks of left lower leg swelling (from knee down), not associated with any trauma.  No sob or recent travel, no fever or chills.  Leg feels sore

## 2014-05-28 ENCOUNTER — Ambulatory Visit (INDEPENDENT_AMBULATORY_CARE_PROVIDER_SITE_OTHER): Payer: BLUE CROSS/BLUE SHIELD | Admitting: Physician Assistant

## 2014-05-28 ENCOUNTER — Encounter: Payer: Self-pay | Admitting: Physician Assistant

## 2014-05-28 ENCOUNTER — Encounter (HOSPITAL_BASED_OUTPATIENT_CLINIC_OR_DEPARTMENT_OTHER): Payer: Self-pay | Admitting: Emergency Medicine

## 2014-05-28 VITALS — BP 156/75 | HR 66 | Temp 98.0°F | Resp 16 | Ht 72.0 in | Wt 305.2 lb

## 2014-05-28 DIAGNOSIS — I82402 Acute embolism and thrombosis of unspecified deep veins of left lower extremity: Secondary | ICD-10-CM | POA: Diagnosis not present

## 2014-05-28 DIAGNOSIS — Z7901 Long term (current) use of anticoagulants: Secondary | ICD-10-CM | POA: Insufficient documentation

## 2014-05-28 DIAGNOSIS — R790 Abnormal level of blood mineral: Secondary | ICD-10-CM | POA: Diagnosis not present

## 2014-05-28 DIAGNOSIS — I82409 Acute embolism and thrombosis of unspecified deep veins of unspecified lower extremity: Secondary | ICD-10-CM | POA: Insufficient documentation

## 2014-05-28 DIAGNOSIS — R760 Raised antibody titer: Secondary | ICD-10-CM

## 2014-05-28 MED ORDER — RIVAROXABAN 15 MG PO TABS
15.0000 mg | ORAL_TABLET | Freq: Once | ORAL | Status: AC
  Administered 2014-05-28: 15 mg via ORAL
  Filled 2014-05-28: qty 1

## 2014-05-28 MED ORDER — XARELTO VTE STARTER PACK 15 & 20 MG PO TBPK: 15.0000 mg | ORAL_TABLET | ORAL | Status: DC

## 2014-05-28 NOTE — Progress Notes (Signed)
Patient presents to clinic today for ER follow-up of acute DVT of LLE. Patient seen in ER this morning 12AM - 2AM.  US revealed acute DVT.  Patient given Xarelto starter pack and instructed to follow-up with PCP.  Patient endorses swelling of extremity but denies pain, numbness or abnormal color/temp. Denies chest pain or SOB.  Denies hx of DVT or PE.  There is questionable family history of such.  Denies recent surgery.  Does spend a lot of time traveling long periods of time.   Past Medical History  Diagnosis Date  . Colitis     Cscope ~ 2007, L sided, Rx lialda  . Glaucoma     Current Outpatient Prescriptions on File Prior to Visit  Medication Sig Dispense Refill  . amLODipine (NORVASC) 5 MG tablet Take 1 tablet (5 mg total) by mouth daily. 90 tablet 1  . latanoprost (XALATAN) 0.005 % ophthalmic solution Place 1 drop into both eyes at bedtime.      . mesalamine (LIALDA) 1.2 G EC tablet Take 1.2 g by mouth 2 (two) times daily. Take 2 tablet by mouth every morning.    Alveda Reasons STARTER PACK 15 & 20 MG TBPK Take 15-20 mg by mouth as directed. Take as directed on package: Start with one 52m tablet by mouth twice a day with food. On Day 22, switch to one 2105mtablet once a day with food. 51 each 0   No current facility-administered medications on file prior to visit.    No Known Allergies  Family History  Problem Relation Age of Onset  . Coronary artery disease Neg Hx   . Diabetes Mother   . Stroke Neg Hx   . Deep vein thrombosis Mother     "clots" mother   . Hypertension Mother   . Prostate cancer Father [id number]. Colon cancer Neg Hx   . Aneurysm Father     brain aneurysm    History   Social History  . Marital Status: Married    Spouse Name: N/A  . Number of Children: 3  . 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 Smoking status: Never Smoker   . Smokeless tobacco: Never Used  . Alcohol Use: No  . Drug  Use: No  . Sexual Activity: Not on file   Other Topics Concern  . None   Social History Narrative   [place]  All other ROS are negative.  BP 156/75 mmHg  Pulse 66  Temp(Src) 98 F (36.7 C) (Oral)  Resp 16  Ht 6' (1.829 m)  Wt 305 lb 4 oz (138.46 kg)  BMI 41.39 kg/m2  SpO2 100%  Physical Exam  Constitutional: He is oriented to person, place, and time.  Cardiovascular: Normal rate.   Pulses:      Popliteal pulses are 2+ on the right side, and 2+ on the left side.       Dorsalis pedis pulses are 2+ on the right side, and 2+ on the left side.       Posterior tibial pulses are 2+ on the right side, and 2+ on the left side.  Significant non-pitting edema or LLE noted from level of knee downward.  Pulmonary/Chest: Effort normal. No respiratory distress.  Neurological: He is alert and oriented to person, place, and time.  Skin: Skin is warm and dry. No rash noted.  Psychiatric: Affect normal.  Vitals reviewed.   Recent Results (from the past 2160 hour(s))  D-dimer, quantitative     Status: Abnormal   Collection Time: 05/27/14  9:34 PM  Result Value Ref Range   D-Dimer, Quant 0.78 (H) 0.00 - 0.48 ug/mL-FEU    Comment:        AT THE INHOUSE ESTABLISHED CUTOFF VALUE OF 0.48 ug/mL FEU, THIS ASSAY HAS BEEN DOCUMENTED IN THE LITERATURE TO HAVE A SENSITIVITY AND NEGATIVE PREDICTIVE VALUE OF AT LEAST 98 TO 99%.  THE TEST RESULT SHOULD BE CORRELATED WITH AN ASSESSMENT OF THE CLINICAL PROBABILITY OF DVT / VTE.   CBC with Differential     Status: Abnormal   Collection Time: 05/27/14  9:34 PM  Result Value Ref Range   WBC 6.7 4.0 - 10.5 K/uL   RBC 5.32 4.22 - 5.81 MIL/uL   Hemoglobin 13.6 13.0 - 17.0 g/dL   HCT 42.1 39.0 - 52.0 %   MCV 79.1 78.0 - 100.0 fL   MCH 25.6 (L) 26.0 - 34.0 pg   MCHC 32.3 30.0 - 36.0 g/dL   RDW 15.5 11.5 - 15.5 %   Platelets 165 150 -  400 K/uL   Neutrophils Relative % 46 43 - 77 %   Neutro Abs 3.1 1.7 - 7.7 K/uL   Lymphocytes Relative 42 12 - 46 %   Lymphs Abs 2.8 0.7 - 4.0 K/uL   Monocytes Relative 9 3 - 12 %   Monocytes Absolute 0.6 0.1 - 1.0 K/uL   Eosinophils Relative 3 0 - 5 %   Eosinophils Absolute 0.2 0.0 - 0.7 K/uL   Basophils Relative 0 0 - 1 %   Basophils Absolute 0.0 0.0 - 0.1 K/uL  Basic metabolic panel     Status: Abnormal   Collection Time: 05/27/14  9:34 PM  Result Value Ref Range   Sodium 137 135 - 145 mmol/L   Potassium 3.7 3.5 - 5.1 mmol/L   Chloride 107 96 - 112 mmol/L   CO2 24 19 - 32 mmol/L   Glucose, Bld 122 (H) 70 - 99 mg/dL   BUN 14 6 - 23 mg/dL   Creatinine, Ser 0.89 0.50 - 1.35 mg/dL   Calcium 8.5 8.4 - 10.5 mg/dL   GFR calc non Af Amer >90 >90 mL/min   GFR calc Af Amer >90 >90 mL/min    Comment: (NOTE) The eGFR has been calculated using the CKD EPI equation. This calculation has not been validated in all clinical situations. eGFR's persistently <90 mL/min signify possible Chronic Kidney Disease.    Anion gap 6 5 - 15    Assessment/Plan: Acute DVT (deep venous thrombosis) Will begin Xarelto 15 mg BID x 21 days.  Will then change to 20 mg once daily.  Samples given and logged in our records.  Compression stockings discussed.  Will check hypercoagulable panel.  Will follow-up in 1 week.  Alarm signs/symptoms that would prompt 911 discussed with patient and wife.

## 2014-05-28 NOTE — Progress Notes (Signed)
Pre visit review using our clinic review tool, if applicable. No additional management support is needed unless otherwise documented below in the visit note/SLS  

## 2014-05-28 NOTE — ED Notes (Addendum)
Ass[place] care of patient from [place]. Pt lying on stretcher resting quietly, denies pain, no distress. Family at bedside. Awaiting EDP disposition.

## 2014-05-28 NOTE — Discharge Instructions (Signed)

## 2014-05-28 NOTE — Assessment & Plan Note (Addendum)
Will begin Xarelto 15 mg BID x 21 days.  Will then change to 20 mg once daily.  Samples given and logged in our records.  Compression stockings discussed.  Will check hypercoagulable panel.  Will follow-up in 1 week.  Alarm signs/symptoms that would prompt 911 discussed with patient and wife.

## 2014-05-28 NOTE — ED Provider Notes (Signed)
CSN: [id number]     Arrival date & time 05/27/14  2109 History  This chart was scribed for Alannis Hsia, MD by Ronney Lion, ED Scribe. This patient was seen in room MH09/MH09 and the patient's care was started at 12:18 AM.    Chief Complaint  Patient presents with  . Leg Swelling   Patient is a 59 y.o. male presenting with leg pain. The history is provided by the patient, a relative and the spouse. No language interpreter was used.  Leg Pain Location:  Leg Time since incident:  2 weeks Injury: no   Leg location:  L lower leg Pain details:    Quality:  Aching   Radiates to:  Does not radiate   Severity:  Mild   Onset quality:  Sudden   Duration:  2 weeks   Timing:  Constant   Progression:  Unchanged Chronicity:  New Relieved by:  Nothing Worsened by:  Nothing tried Ineffective treatments:  Elevation Associated symptoms: swelling   Associated symptoms: no back pain, no fatigue, no fever and no tingling   Risk factors: no concern for non-accidental trauma     HPI Comments: Luisfelipe Engelstad is a 59 y.o. male who presents to the Emergency Department complaining of constant, unchanged leg swelling that began 2 weeks ago. He denies any trauma to the leg. Patient has been elevating it and massaging it at home. He has not seen anyone for this or tried any other treatments. Patient uses eye drops (Xalatan) regularly and has a history of colitis, for which he takes Lialda. He denies a history of surgeries. He denies a history of hyperlipidemia, hypertension, or DM. Patient denies SOB, rash, fever, vomiting, diarrhea, or difficulty urinating. He also denies recent traveling or surgeries.  PCP Willow Ora, MD   Past Medical History  Diagnosis Date  . Colitis     Cscope ~ 2007, L sided, Rx lialda  . Glaucoma    Past Surgical History  Procedure Laterality Date  . Cystectomy      from leg    Family History  Problem Relation Age of Onset  . Coronary artery disease Neg Hx   . Diabetes Mother    . Stroke Neg Hx   . Deep vein thrombosis Mother     "clots" mother   . Hypertension Mother   . Prostate cancer Father 68  . Colon cancer Neg Hx   . Aneurysm Father     brain aneurysm   History  Substance Use Topics  . Smoking status: Never Smoker   . Smokeless tobacco: Never Used  . Alcohol Use: No    Review of Systems  Constitutional: Negative for fever and fatigue.  Respiratory: Negative for cough, chest tightness and shortness of breath.   Cardiovascular: Positive for leg swelling. Negative for chest pain and palpitations.  Gastrointestinal: Negative for vomiting and diarrhea.  Genitourinary: Negative for difficulty urinating.  Musculoskeletal: Negative for back pain.  Skin: Negative for rash.  Neurological: Negative for weakness and light-headedness.  All other systems reviewed and are negative.     Allergies  Review of patient's allergies indicates no known allergies.  Home Medications   Prior to Admission medications   Medication Sig Start Date End Date Taking? Authorizing Provider  amLODipine (NORVASC) 5 MG tablet Take 1 tablet (5 mg total) by mouth daily. 01/29/14   Wanda Plump, MD  latanoprost (XALATAN) 0.005 % ophthalmic solution Place 1 drop into both eyes at bedtime.  Historical Provider, MD  mesalamine (LIALDA) 1.2 G EC tablet Take 2 tablet by mouth every morning.     Historical Provider, MD   BP 154/62 mmHg  Pulse 57  Temp(Src) 99 F (37.2 C) (Oral)  Resp 22  Ht 6' (1.829 m)  Wt 308 lb 9.6 oz (139.98 kg)  BMI 41.84 kg/m2  SpO2 98% Physical Exam  Constitutional: He is oriented to person, place, and time. He appears well-developed and well-nourished. No distress.  HENT:  Head: Normocephalic and atraumatic.  Mouth/Throat: Oropharynx is clear and moist.  Eyes: Conjunctivae and EOM are normal. Pupils are equal, round, and reactive to light.  Neck: Normal range of motion. Neck supple. No tracheal deviation present.  Cardiovascular: Normal rate,  regular rhythm and normal heart sounds.   Pulses:      Dorsalis pedis pulses are 2+ on the right side, and 2+ on the left side.       Posterior tibial pulses are 2+ on the right side, and 2+ on the left side.  2+ DP and PT pulses.  Pulmonary/Chest: Effort normal and breath sounds normal. No respiratory distress. He has no wheezes. He has no rales.  Lungs are clear to auscultation.   Abdominal: Soft. Bowel sounds are normal. He exhibits no mass. There is no tenderness. There is no rebound and no guarding.  Musculoskeletal: Normal range of motion.       Left ankle: He exhibits swelling. He exhibits normal range of motion, no deformity, no laceration and normal pulse. No tenderness. No lateral malleolus and no medial malleolus tenderness found. Achilles tendon normal.       Left lower leg: He exhibits swelling. He exhibits no bony tenderness, no deformity and no laceration.  No cords. No tibial plateau tenderness. Negative posterior and anterior drawer test. No joint laxity with valgus or varus test. Cap refill <2 sec. No crepitus, no deformities.  Neurological: He is alert and oriented to person, place, and time.  Skin: Skin is warm and dry.  Psychiatric: He has a normal mood and affect. His behavior is normal.  Nursing note and vitals reviewed.   ED Course  Procedures (including critical care time)  DIAGNOSTIC STUDIES: Oxygen Saturation is 98% on room air, normal by my interpretation.    COORDINATION OF CARE: 12:24 AM - Discussed treatment plan with pt at bedside which includes awaiting U/S radiology reading, and pt agreed to plan.   Labs Review Labs Reviewed  D-DIMER, QUANTITATIVE - Abnormal; Notable for the following:    D-Dimer, Quant 0.78 (*)    All other components within normal limits  CBC WITH DIFFERENTIAL/PLATELET - Abnormal; Notable for the following:    MCH 25.6 (*)    All other components within normal limits  BASIC METABOLIC PANEL - Abnormal; Notable for the following:     Glucose, Bld 122 (*)    All other components within normal limits    Imaging Review US Venous Img Lower Unilateral Left  05/28/2014   CLINICAL DATA:  Subacute onset of left calf and foot swelling for 2 weeks. Aching at the top of the left foot. Initial encounter.  EXAM: LEFT LOWER EXTREMITY VENOUS DOPPLER ULTRASOUND  TECHNIQUE: Gray-scale sonography with graded compression, as well as color Doppler and duplex ultrasound were performed to evaluate the lower extremity deep venous systems from the level of the common femoral vein and including the common femoral, femoral, profunda femoral, popliteal and calf veins including the posterior tibial, peroneal and gastrocnemius veins when visible.  The superficial great saphenous vein was also interrogated. Spectral Doppler was utilized to evaluate flow at rest and with distal augmentation maneuvers in the common femoral, femoral and popliteal veins.  COMPARISON:  None.  FINDINGS: Contralateral Common Femoral Vein: Respiratory phasicity is normal and symmetric with the symptomatic side. No evidence of thrombus. Normal compressibility.  Common Femoral Vein: No evidence of thrombus. Normal compressibility, respiratory phasicity and response to augmentation.  Saphenofemoral Junction: No evidence of thrombus. Normal compressibility and flow on color Doppler imaging.  Profunda Femoral Vein: No evidence of thrombus. Normal compressibility and flow on color Doppler imaging.  Femoral Vein: No evidence of thrombus. Normal compressibility, respiratory phasicity and response to augmentation.  Popliteal Vein: No evidence of thrombus. Normal compressibility, respiratory phasicity and response to augmentation.  Calf Veins: Occlusive venous thrombus is noted within the left tibioperoneal trunk, just below the popliteal fossa. The more inferior calf veins are not characterized on this study.  Superficial Great Saphenous Vein: No evidence of thrombus. Normal compressibility and flow  on color Doppler imaging.  Venous Reflux:  None.  Other Findings:  None.  IMPRESSION: Occlusive deep venous thrombosis noted within the left tibioperoneal trunk, just below the popliteal fossa. The more inferior calf veins are not characterized on this study. No additional deep venous thrombosis seen.  These results were called by telephone at the time of interpretation on 05/28/2014 at 12:21 am to Dr. Gwyneth SproutWHITNEY PLUNKETT, who verbally acknowledged these results.   Electronically Signed   By: Roanna RaiderJeffery  Chang M.D.   On: 05/28/2014 00:21     EKG Interpretation None      MDM   Final diagnoses:  Leg swelling   Patient denies chest pain and shortness of breath at rest or with exertion or any kind.  Highly doubt PE at this time as patient's pulse is low normal with normal BP and oxygen saturation of 100% on room air. Have started Xarelto in the ED and prescribed the Xarelto starter pack.  I have advised the patient to Call Dr. Drue NovelPaz in the am to be seen for ongoing care.  Return immediately for chest pain or shortness of breath cough, hemoptysis or any concerns.  Patient and family verbalize understanding and agree to follow up  I personally performed the services described in this documentation, which was scribed in my presence. The recorded information has been reviewed and is accurate.      Cy BlamerApril Kieon Lawhorn, MD 05/28/14 [phone]

## 2014-05-28 NOTE — Patient Instructions (Signed)
Please take the Xarelto as follows: Take 15 mg tablet twice daily for 3 weeks (21 days) Then start 20 mg tablet daily.  Get compression stockings from the pharmacy and wear daily. Follow-up with Dr. Drue Novel in 1-2 weeks.   If you notice any chest pain or shortness of breath, please call 911.  Deep Vein Thrombosis A deep vein thrombosis (DVT) is a blood clot that develops in the deep, larger veins of the leg, arm, or pelvis. These are more dangerous than clots that might form in veins near the surface of the body. A DVT can lead to serious and even life-threatening complications if the clot breaks off and travels in the bloodstream to the lungs.  A DVT can damage the valves in your leg veins so that instead of flowing upward, the blood pools in the lower leg. This is called post-thrombotic syndrome, and it can result in pain, swelling, discoloration, and sores on the leg. CAUSES Usually, several things contribute to the formation of blood clots. Contributing factors include:  The flow of blood slows down.  The inside of the vein is damaged in some way.  You have a condition that makes blood clot more easily. RISK FACTORS Some people are more likely than others to develop blood clots. Risk factors include:   Smoking.  Being overweight (obese).  Sitting or lying still for a long time. This includes long-distance travel, paralysis, or recovery from an illness or surgery. Other factors that increase risk are:   Older age, especially over 65 years of age.  Having a family history of blood clots or if you have already had a blot clot.  Having major or lengthy surgery. This is especially true for surgery on the hip, knee, or belly (abdomen). Hip surgery is particularly high risk.  Having a long, thin tube (catheter) placed inside a vein during a medical procedure.  Breaking a hip or leg.  Having cancer or cancer treatment.  Pregnancy and childbirth.  Hormone changes make the blood clot  more easily during pregnancy.  The fetus puts pressure on the veins of the pelvis.  There is a risk of injury to veins during delivery or a caesarean delivery. The risk is highest just after childbirth.  Medicines containing the male hormone estrogen. This includes birth control pills and hormone replacement therapy.  Other circulation or heart problems.  SIGNS AND SYMPTOMS When a clot forms, it can either partially or totally block the blood flow in that vein. Symptoms of a DVT can include:  Swelling of the leg or arm, especially if one side is much worse.  Warmth and redness of the leg or arm, especially if one side is much worse.  Pain in an arm or leg. If the clot is in the leg, symptoms may be more noticeable or worse when standing or walking. The symptoms of a DVT that has traveled to the lungs (pulmonary embolism, PE) usually start suddenly and include:  Shortness of breath.  Coughing.  Coughing up blood or blood-tinged mucus.  Chest pain. The chest pain is often worse with deep breaths.  Rapid heartbeat. Anyone with these symptoms should get emergency medical treatment right away. Do not wait to see if the symptoms will go away. Call your local emergency services (911 in the [place] if you have these symptoms. Do not drive yourself to the hospital. DIAGNOSIS If a DVT is suspected, your health care provider will take a full medical history and perform a physical exam. Tests  that also may be required include:  Blood tests, including studies of the clotting properties of the blood.  Ultrasound to see if you have clots in your legs or lungs.  X-rays to show the flow of blood when dye is injected into the veins (venogram).  Studies of your lungs if you have any chest symptoms. PREVENTION  Exercise the legs regularly. Take a brisk 30-minute walk every day.  Maintain a weight that is appropriate for your height.  Avoid sitting or lying in bed for long periods of time  without moving your legs.  Women, particularly those over the age of 35 years, should consider the risks and benefits of taking estrogen medicines, including birth control pills.  Do not smoke, especially if you take estrogen medicines.  Long-distance travel can increase your risk of DVT. You should exercise your legs by walking or pumping the muscles every hour.  Many of the risk factors above relate to situations that exist with hospitalization, either for illness, injury, or elective surgery. Prevention may include medical and nonmedical measures.  Your health care provider will assess you for the need for venous thromboembolism prevention when you are admitted to the hospital. If you are having surgery, your surgeon will assess you the day of or day after surgery. TREATMENT Once identified, a DVT can be treated. It can also be prevented in some circumstances. Once you have had a DVT, you may be at increased risk for a DVT in the future. The most common treatment for DVT is blood-thinning (anticoagulant) medicine, which reduces the blood's tendency to clot. Anticoagulants can stop new blood clots from forming and stop old clots from growing. They cannot dissolve existing clots. Your body does this by itself over time. Anticoagulants can be given by mouth, through an IV tube, or by injection. Your health care provider will determine the best program for you. Other medicines or treatments that may be used are:  Heparin or related medicines (low molecular weight heparin) are often the first treatment for a blood clot. They act quickly. However, they cannot be taken orally and must be given either in shot form or by IV tube.  Heparin can cause a fall in a component of blood that stops bleeding and forms blood clots (platelets). You will be monitored with blood tests to be sure this does not occur.  Warfarin is an anticoagulant that can be swallowed. It takes a few days to start working, so usually  heparin or related medicines are used in combination. Once warfarin is working, heparin is usually stopped.  Factor Xa inhibitor medicines, such as rivaroxaban and apixaban, also reduce blood clotting. These medicines are taken orally and can often be used without heparin or related medicines.  Less commonly, clot dissolving drugs (thrombolytics) are used to dissolve a DVT. They carry a high risk of bleeding, so they are used mainly in severe cases where your life or a part of your body is threatened.  Very rarely, a blood clot in the leg needs to be removed surgically.  If you are unable to take anticoagulants, your health care provider may arrange for you to have a filter placed in a main vein in your abdomen. This filter prevents clots from traveling to your lungs. HOME CARE INSTRUCTIONS  Take all medicines as directed by your health care provider.  Learn as much as you can about DVT.  Wear a medical alert bracelet or carry a medical alert card.  Ask your health care provider  how soon you can go back to normal activities. It is important to stay active to prevent blood clots. If you are on anticoagulant medicine, avoid contact sports.  It is very important to exercise. This is especially important while traveling, sitting, or standing for long periods of time. Exercise your legs by walking or by tightening and relaxing your leg muscles regularly. Take frequent walks.  You may need to wear compression stockings. These are tight elastic stockings that apply pressure to the lower legs. This pressure can help keep the blood in the legs from clotting. Taking Warfarin Warfarin is a daily medicine that is taken by mouth. Your health care provider will advise you on the length of treatment (usually 3-6 months, sometimes lifelong). If you take warfarin:  Understand how to take warfarin and foods that can affect how warfarin works in Public relations account executive.  Too much and too little warfarin are both  dangerous. Too much warfarin increases the risk of bleeding. Too little warfarin continues to allow the risk for blood clots. Warfarin and Regular Blood Testing While taking warfarin, you will need to have regular blood tests to measure your blood clotting time. These blood tests usually include both the prothrombin time (PT) and international normalized ratio (INR) tests. The PT and INR results allow your health care provider to adjust your dose of warfarin. It is very important that you have your PT and INR tested as often as directed by your health care provider.  Warfarin and Your Diet Avoid major changes in your diet, or notify your health care provider before changing your diet. Arrange a visit with a registered dietitian to answer your questions. Many foods, especially foods high in vitamin K, can interfere with warfarin and affect the PT and INR results. You should eat a consistent amount of foods high in vitamin K. Foods high in vitamin K include:   Spinach, kale, broccoli, cabbage, collard and turnip greens, Brussels sprouts, peas, cauliflower, seaweed, and parsley.  Beef and pork liver.  Green tea.  Soybean oil. Warfarin with Other Medicines Many medicines can interfere with warfarin and affect the PT and INR results. You must:  Tell your health care provider about any and all medicines, vitamins, and supplements you take, including aspirin and other over-the-counter anti-inflammatory medicines. Be especially cautious with aspirin and anti-inflammatory medicines. Ask your health care provider before taking these.  Do not take or discontinue any prescribed or over-the-counter medicine except on the advice of your health care provider or pharmacist. Warfarin Side Effects Warfarin can have side effects, such as easy bruising and difficulty stopping bleeding. Ask your health care provider or pharmacist about other side effects of warfarin. You will need to:  Hold pressure over cuts for  longer than usual.  Notify your dentist and other health care providers that you are taking warfarin before you undergo any procedures where bleeding may occur. Warfarin with Alcohol and Tobacco   Drinking alcohol frequently can increase the effect of warfarin, leading to excess bleeding. It is best to avoid alcoholic drinks or to consume only very small amounts while taking warfarin. Notify your health care provider if you change your alcohol intake.   Do not use any tobacco products including cigarettes, chewing tobacco, or electronic cigarettes. If you smoke, quit. Ask your health care provider for help with quitting smoking. Alternative Medicines to Warfarin: Factor Xa Inhibitor Medicines  These blood-thinning medicines are taken by mouth, usually for several weeks or longer. It is important to take  the medicine every single day at the same time each day.  There are no regular blood tests required when using these medicines.  There are fewer food and drug interactions than with warfarin.  The side effects of this class of medicine are similar to those of warfarin, including excessive bruising or bleeding. Ask your health care provider or pharmacist about other potential side effects. SEEK MEDICAL CARE IF:  You notice a rapid heartbeat.  You feel weaker or more tired than usual.  You feel faint.  You notice increased bruising.  You feel your symptoms are not getting better in the time expected.  You believe you are having side effects of medicine. SEEK IMMEDIATE MEDICAL CARE IF:  You have chest pain.  You have trouble breathing.  You have new or increased swelling or pain in one leg.  You cough up blood.  You notice blood in vomit, in a bowel movement, or in urine. MAKE SURE YOU:  Understand these instructions.  Will watch your condition.  Will get help right away if you are not doing well or get worse. Document Released: 01/19/2005 Document Revised: 06/05/2013  Document Reviewed: 09/26/2012 [facility] Patient Information 2015 [employer] [place]. This information is not intended to replace advice given to you by your health care provider. Make sure you discuss any questions you have with your health care provider.

## 2014-05-31 LAB — HYPERCOAGULABLE PANEL, COMPREHENSIVE
ANTICARDIOLIPIN IGA: 2 U/mL (ref <22)
ANTICARDIOLIPIN IGG: 5 GPL U/mL (ref <23)
ANTITHROMB III FUNC: 71 % — AB (ref 76–126)
Anticardiolipin IgM: 0 MPL U/mL (ref <11)
BETA 2 GLYCO I IGG: 2 G Units (ref <20)
BETA-2-GLYCOPROTEIN I IGM: 5 M Units (ref <20)
Beta-2-Glycoprotein I IgA: 5 A Units (ref <20)
DRVVT 1:1 Mix: 42.9 secs — ABNORMAL HIGH (ref <42.9)
DRVVT CONFIRMATION: 1.18 ratio — AB (ref <1.15)
DRVVT: 54.9 secs — ABNORMAL HIGH (ref <42.9)
LUPUS ANTICOAGULANT: DETECTED — AB
PTT Lupus Anticoagulant: 49.9 secs — ABNORMAL HIGH (ref 28.0–43.0)
PTTLA 4:1 Mix: 42.5 secs (ref 28.0–43.0)
Protein C Activity: 121 % (ref 75–133)
Protein C, Total: 82 % (ref 72–160)
Protein S Activity: 114 % (ref 69–129)
Protein S Total: 86 % (ref 60–150)

## 2014-05-31 NOTE — Addendum Note (Signed)
Addended by: Noreene LarssonLARSON, Elih Mooney A on: 05/31/2014 08:28 AM   Modules accepted: Orders

## 2014-06-06 ENCOUNTER — Encounter: Payer: Self-pay | Admitting: Internal Medicine

## 2014-06-06 ENCOUNTER — Ambulatory Visit (INDEPENDENT_AMBULATORY_CARE_PROVIDER_SITE_OTHER): Payer: BLUE CROSS/BLUE SHIELD | Admitting: Internal Medicine

## 2014-06-06 VITALS — BP 130/82 | HR 58 | Temp 97.7°F | Ht 72.0 in | Wt 302.2 lb

## 2014-06-06 DIAGNOSIS — I82402 Acute embolism and thrombosis of unspecified deep veins of left lower extremity: Secondary | ICD-10-CM

## 2014-06-06 MED ORDER — AMLODIPINE BESYLATE 5 MG PO TABS: 5.0000 mg | ORAL_TABLET | Freq: Every day | ORAL | Status: DC

## 2014-06-06 MED ORDER — RIVAROXABAN 20 MG PO TABS: 20.0000 mg | ORAL_TABLET | Freq: Every day | ORAL | Status: DC

## 2014-06-06 NOTE — Patient Instructions (Addendum)
Switch to xarelto 20 mg after 21 days  Come back in 3 months for a physical, fasting

## 2014-06-06 NOTE — Progress Notes (Signed)
Subjective:    Patient ID: Mitchell RilesLouis Veselka, male    DOB: [birth year], 57 y.o.   MRN: [id number]  DOS:  06/06/2014 Type of visit - description : f/u Interval history: developed left lower extremity edema approximately 05/13/2014,subeqently went to the ER and was dx w/a DVT Patient started Xarelto, subsequently was seen at this office, tested positive for lupus anticoagulant. The edema has decreased significantly, denies lower extremity pain. Prior to the DVT, he did not have any periods on immobilization, prolonged car trips or airplane trips. 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      Review of Systems Denies chest pain or difficulty breathing No nausea, vomiting, diarrhea, blood in the stools or abdominal pain No gross hematuria  Past Medical History  Diagnosis Date  . Colitis     Cscope ~ 2007, L sided, Rx lialda  . Glaucoma     Past Surgical History  Procedure Laterality Date  . Cystectomy      from leg     History   Social History  . Marital Status: Married    Spouse Name: N/A  . Number of Children: 3  . 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 Smoking status: Never Smoker   . Smokeless tobacco: Never Used  . Alcohol Use: No  . Drug Use: No  . Sexual Activity: Not on file   Other Topics Concern  . Not on file   Social History Narrative   Original from [place], moved to the [place] in [place], moved to [place] ~ 1995                       Medication List       This list is accurate as of: 06/06/14  5:52 PM.  Always use your most recent med list.               amLODipine 5 MG tablet  Commonly known as:  NORVASC  Take 1 tablet (5 mg total) by mouth daily.     latanoprost 0.005 % ophthalmic solution  Commonly known as:  XALATAN  Place 1 drop into both eyes at bedtime.     mesalamine 1.2 G EC tablet  Commonly known as:  LIALDA  Take 1.2 g by  mouth 2 (two) times daily. Take 2 tablet by mouth every morning.     rivaroxaban 20 MG Tabs tablet  Commonly known as:  XARELTO  Take 1 tablet (20 mg total) by mouth daily with supper.           Objective:   Physical Exam BP 130/82 mmHg  Pulse 58  Temp(Src) 97.7 F (36.5 C) (Oral)  Ht 6' (1.829 m)  Wt 302 lb 4 oz (137.1 kg)  BMI 40.98 kg/m2  SpO2 98%  General:   Well developed, well nourished . NAD.  HEENT:  Normocephalic . Face symmetric, atraumatic Legs: +/+++ L periankle edema Left calf is no TTP, half inch are larger in diameter compared to the right Skin: Not pale. Not jaundice Neurologic:  alert & oriented X3.  Speech normal, gait appropriate for age and unassisted Psych--  Cognition and judgment appear intact.  Cooperative with normal attention span and concentration.  Behavior appropriate. No anxious or depressed appearing.       Assessment & Plan:    Hypertension,  BP is very good today, refill amlodipine  Today , I spent more than 25   min with the patient: >50% of the time counseling regards recent lab results

## 2014-06-06 NOTE — Assessment & Plan Note (Signed)
Was diagnosed last month with an acute left leg DVT. 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 No personal history of previous DVT, question of his mother having a clot  many years ago. He tested positive for lupus anticoagulant. Current Xarelto, knows to switch from 15 mg twice a day to 20 mg daily after 21 days of therapy. Plan: Continue Xarelto See hematology as recommended Multiple questions from wife  about the meaning of + lupus anticoagulant, they were answered to the best of my ability, I don't think at this time I can tell that he has SLE per se.  rec to discuss the labs in more detail with hematology

## 2014-06-06 NOTE — Progress Notes (Signed)
Pre visit review using our clinic review tool, if applicable. No additional management support is needed unless otherwise documented below in the visit note. 

## 2014-06-22 ENCOUNTER — Ambulatory Visit: Payer: BLUE CROSS/BLUE SHIELD

## 2014-06-22 ENCOUNTER — Ambulatory Visit (HOSPITAL_BASED_OUTPATIENT_CLINIC_OR_DEPARTMENT_OTHER): Payer: BLUE CROSS/BLUE SHIELD | Admitting: Family

## 2014-06-22 ENCOUNTER — Encounter: Payer: Self-pay | Admitting: Family

## 2014-06-22 ENCOUNTER — Other Ambulatory Visit: Payer: Self-pay | Admitting: Family

## 2014-06-22 ENCOUNTER — Ambulatory Visit (HOSPITAL_BASED_OUTPATIENT_CLINIC_OR_DEPARTMENT_OTHER): Payer: BLUE CROSS/BLUE SHIELD

## 2014-06-22 VITALS — BP 159/86 | HR 57 | Temp 97.4°F | Resp 20 | Ht 72.0 in | Wt 304.0 lb

## 2014-06-22 DIAGNOSIS — I82402 Acute embolism and thrombosis of unspecified deep veins of left lower extremity: Secondary | ICD-10-CM | POA: Diagnosis not present

## 2014-06-22 LAB — CBC WITH DIFFERENTIAL (CANCER CENTER ONLY)
BASO#: 0 10*3/uL (ref 0.0–0.2)
BASO%: 0.3 % (ref 0.0–2.0)
EOS ABS: 0.1 10*3/uL (ref 0.0–0.5)
EOS%: 1.8 % (ref 0.0–7.0)
HCT: 43.7 % (ref 38.7–49.9)
HGB: 14.6 g/dL (ref 13.0–17.1)
LYMPH#: 1.9 10*3/uL (ref 0.9–3.3)
LYMPH%: 30.4 % (ref 14.0–48.0)
MCH: 26.1 pg — ABNORMAL LOW (ref 28.0–33.4)
MCHC: 33.4 g/dL (ref 32.0–35.9)
MCV: 78 fL — ABNORMAL LOW (ref 82–98)
MONO#: 0.5 10*3/uL (ref 0.1–0.9)
MONO%: 7.8 % (ref 0.0–13.0)
NEUT%: 59.7 % (ref 40.0–80.0)
NEUTROS ABS: 3.8 10*3/uL (ref 1.5–6.5)
Platelets: 188 10*3/uL (ref 145–400)
RBC: 5.6 10*6/uL (ref 4.20–5.70)
RDW: 14.8 % (ref 11.1–15.7)
WBC: 6.3 10*3/uL (ref 4.0–10.0)

## 2014-06-22 NOTE — Progress Notes (Signed)
Hematology/Oncology Consultation   Name: Mitchell Thornton      MRN: [id number]    Location: Room/bed info not found  Date: 06/22/2014 Time:3:07 PM   REFERRING PHYSICIAN: Willow Ora, MD  REASON FOR CONSULT:  DVT of the    DIAGNOSIS:    ICD-9-CM ICD-10-CM   1. Acute DVT (deep venous thrombosis), left 453.40 I82.402 US Venous Img Lower Unilateral Left     CBC with Differential [facility])    HISTORY OF PRESENT ILLNESS:  [name] is a very pleasant 59 yo [place] American male with a recently diagnosed left lower extremity DVT within the left tibioperoneal trunk, just below the popliteal fossa. This is his first blood clot. He went to the ED in April for swelling and discomfort in his left. He also had a positive Lupus anticoagulant. We will recheck this.  He started Xarelto on April 25th and has done well. The swelling in his leg has gone down. He is wearing a compression stocking regularly.   He had not done any travelling prior to having the clot.  He thinks that his mother had a blood clot but he is not sure.  He has no family history of sickle cell or cancer.  He has HTN and diverticulosis. No diabetes. He is not any any type of testosterone supplement.  He denies fatigue, fever, chills, n/v, cough, rash, dizziness, SOB, chest pain, palpitations, abdominal pain, constipation, diarrhea, blood in urine or stool.  No lymphadenopathy on exam.  He has 3 children all of whom are health.  No tenderness, numbness or tingling in his extremities.  His appetite is good and he is staying hydrated. We discussed the importance of him drinking lots of water.  He is wanting to take his wife on a trip and asked about travel. He knows to stop every couple of hours to get out and walk and to wear a compression stocking.  He is active and does a lot of walking and some jogging.  He is originally from [place] and moved to the [place] in 1983. He works for a Astr[employer].    ROS: All other 10 point review of systems is negative.   PAST MEDICAL HISTORY:   Past Medical History  Diagnosis Date  . Colitis     Cscope ~ 2007, L sided, Rx lialda  . Glaucoma     ALLERGIES: No Known Allergies    MEDICATIONS:  Current Outpatient Prescriptions on File Prior to Visit  Medication Sig Dispense Refill  . amLODipine (NORVASC) 5 MG tablet Take 1 tablet (5 mg total) by mouth daily. 90 tablet 1  . latanoprost (XALATAN) 0.005 % ophthalmic solution Place 1 drop into both eyes at bedtime.      . mesalamine (LIALDA) 1.2 G EC tablet Take 1.2 g by mouth 2 (two) times daily. Take 2 tablet by mouth every morning.    . rivaroxaban (XARELTO) 20 MG TABS tablet Take 1 tablet (20 mg total) by mouth daily with supper. 30 tablet 4   No current facility-administered medications on file prior to visit.     PAST SURGICAL HISTORY Past Surgical History  Procedure Laterality Date  . Cystectomy      from leg     FAMILY HISTORY: Family History  Problem Relation Age of Onset  . Coronary artery disease Neg Hx   . Diabetes Mother   . Stroke Neg Hx   . Deep vein thrombosis Mother     "clots"  mother   . Hypertension Mother   . Prostate cancer Father [id number]  . Colon cancer Neg Hx   . Aneurysm Father     brain aneurysm    SOCIAL HISTORY:  reports that he has never smoked. He has never used smokeless tobacco. He reports that he does not drink alcohol or use illicit drugs.  PERFORMANCE STATUS: The patient's performance status is 1 - Symptomatic but completely ambulatory  PHYSICAL EXAM: Most Recent Vital Signs: Blood pressure 159/86, pulse 57, temperature 97.4 F (36.3 C), temperature source Oral, resp. rate 20, height 6' (1.829 m), weight 304 lb (137.893 kg). BP 159/86 mmHg  Pulse 57  Temp(Src) 97.4 F (36.3 C) (Oral)  Resp 20  Ht 6' (1.829 m)  Wt 304 lb (137.893 kg)  BMI 41.22 kg/m2  General Appearance:    Alert, cooperative, no distress, appears stated age  Head:     Normocephalic, without obvious abnormality, atraumatic  Eyes:    PERRL, conjunctiva/corneas clear, EOM's intact, fundi    benign, both eyes             Throat:   Lips, mucosa, and tongue normal; teeth and gums normal  Neck:   Supple, symmetrical, trachea midline, no adenopathy;       thyroid:  No enlargement/tenderness/nodules; no carotid   bruit or JVD  Back:     Symmetric, no curvature, ROM normal, no CVA tenderness  Lungs:     Clear to auscultation bilaterally, respirations unlabored  Chest wall:    No tenderness or deformity  Heart:    Regular rate and rhythm, S1 and S2 normal, no murmur, rub   or gallop  Abdomen:     Soft, non-tender, bowel sounds active all four quadrants,    no masses, no organomegaly        Extremities:   Extremities normal, atraumatic, no cyanosis or edema  Pulses:   2+ and symmetric all extremities  Skin:   Skin color, texture, turgor normal, no rashes or lesions  Lymph nodes:   Cervical, supraclavicular, and axillary nodes normal  Neurologic:   CNII-XII intact. Normal strength, sensation and reflexes      throughout   LABORATORY DATA:  Results for orders placed or performed in visit on 06/22/14 (from the past 48 hour(s))  CBC with Differential [facility]     Status: Abnormal   Collection Time: 06/22/14  9:27 AM  Result Value Ref Range   WBC 6.3 4.0 - 10.0 10e3/uL   RBC 5.60 4.20 - 5.70 10e6/uL   HGB 14.6 13.0 - 17.1 g/dL   HCT 45.443.7 09.838.7 - 11.949.9 %   MCV 78 (L) 82 - 98 fL   MCH 26.1 (L) 28.0 - 33.4 pg   MCHC 33.4 32.0 - 35.9 g/dL   RDW 14.714.8 82.911.1 - 56.215.7 %   Platelets 188 145 - 400 10e3/uL   NEUT# 3.8 1.5 - 6.5 10e3/uL   LYMPH# 1.9 0.9 - 3.3 10e3/uL   MONO# 0.5 0.1 - 0.9 10e3/uL   Eosinophils Absolute 0.1 0.0 - 0.5 10e3/uL   BASO# 0.0 0.0 - 0.2 10e3/uL   NEUT% 59.7 40.0 - 80.0 %   LYMPH% 30.4 14.0 - 48.0 %   MONO% 7.8 0.0 - 13.0 %   EOS% 1.8 0.0 - 7.0 %   BASO% 0.3 0.0 - 2.0 %      RADIOGRAPHY: No results found.     PATHOLOGY:  None  ASSESSMENT/PLAN: [name] is a very pleasant 59 yo African  American male with a recently diagnosed left lower extremity DVT. This is his first blood clot and he also had a positive Lupus anticoagulant. We are rechecking this today. He is currently on Xarelto 20 mg daily and will continue this for 6 months.  We will plan to see him back in 2 months for labs, follow-up and repeat ultrasound of the left leg.   All questions were answered. The patient knows to call the clinic with any problems, questions or concerns. We can certainly see the patient much sooner if necessary.  The patient was discussed with and also seen by Dr. Myna HidalgoEnnever and he is in agreement with the aforementioned.   [facility] CenterCINCINNAT[facility],Lilliauna Van M    Addendum:  I saw and examined the patient with Nagee Goates. [name] is a very nice. It was nice talking to him.  I'm not sure if this "positive" Lupus anticoagulant is significant. We have I have to have this repeated in a couple months. Sometimes, with an acute thromboembolic event, the lupus anticoagulant can be positive out of stress.  He did have a mildly depressed antithrombin 3 level. However, I don't think that this would've been low enough to cause a thromboembolic event.  I believe that 6 months of anticoagulation will be important. I probably would repeat his antithrombin 3 level we see him back. I also would repeat his lupus anticoagulant studies.  We spent about 45 minutes with him. We answered all his questions. Again, he is very nice and it was a pleasure seeing him.  We will get another Doppler of his left leg when we see him back in a couple months.  Hewitt ShortsPete E

## 2014-06-27 LAB — HYPERCOAGULABLE PANEL, COMPREHENSIVE
ANTICARDIOLIPIN IGM: 2 [MPL'U]/mL (ref <11)
AntiThromb III Func: 120 % (ref 76–126)
Anticardiolipin IgA: 7 APL U/mL (ref <22)
Anticardiolipin IgG: 14 GPL U/mL (ref <23)
BETA-2-GLYCOPROTEIN I IGM: 1 M Units (ref <20)
Beta-2 Glyco I IgG: 12 G Units (ref <20)
Beta-2-Glycoprotein I IgA: 4 A Units (ref <20)
DRVVT 1 1 MIX: 44.3 s — AB (ref <42.9)
DRVVT CONFIRMATION: 1.25 ratio — AB (ref <1.15)
DRVVT: 61.3 s — AB (ref <42.9)
Lupus Anticoagulant: DETECTED — AB
PROTEIN C, TOTAL: 78 % (ref 72–160)
PTT Lupus Anticoagulant: 40.9 secs (ref 28.0–43.0)
Protein C Activity: 127 % (ref 75–133)
Protein S Activity: 92 % (ref 69–129)
Protein S Total: 93 % (ref 60–150)

## 2014-07-02 LAB — HEXAGONAL PHOSPHOLIPID NEUTRALIZATION: Hex Phosph Neut Test: NEGATIVE

## 2014-08-20 ENCOUNTER — Ambulatory Visit (HOSPITAL_BASED_OUTPATIENT_CLINIC_OR_DEPARTMENT_OTHER)
Admission: RE | Admit: 2014-08-20 | Discharge: 2014-08-20 | Disposition: A | Discharge status: Home / Self Care | Payer: BLUE CROSS/BLUE SHIELD | Source: Ambulatory Visit | Attending: Family | Admitting: Family

## 2014-08-20 ENCOUNTER — Ambulatory Visit (HOSPITAL_BASED_OUTPATIENT_CLINIC_OR_DEPARTMENT_OTHER): Payer: BLUE CROSS/BLUE SHIELD | Admitting: Hematology & Oncology

## 2014-08-20 ENCOUNTER — Other Ambulatory Visit (HOSPITAL_BASED_OUTPATIENT_CLINIC_OR_DEPARTMENT_OTHER): Payer: BLUE CROSS/BLUE SHIELD

## 2014-08-20 ENCOUNTER — Encounter: Payer: Self-pay | Admitting: Hematology & Oncology

## 2014-08-20 VITALS — BP 140/84 | HR 50 | Temp 97.9°F | Resp 18 | Ht 72.0 in | Wt 305.0 lb

## 2014-08-20 DIAGNOSIS — D529 Folate deficiency anemia, unspecified: Secondary | ICD-10-CM | POA: Diagnosis not present

## 2014-08-20 DIAGNOSIS — I82402 Acute embolism and thrombosis of unspecified deep veins of left lower extremity: Secondary | ICD-10-CM | POA: Diagnosis present

## 2014-08-20 DIAGNOSIS — I82403 Acute embolism and thrombosis of unspecified deep veins of lower extremity, bilateral: Secondary | ICD-10-CM | POA: Diagnosis not present

## 2014-08-20 LAB — CBC WITH DIFFERENTIAL (CANCER CENTER ONLY)
BASO#: 0 10*3/uL (ref 0.0–0.2)
BASO%: 0.6 % (ref 0.0–2.0)
EOS ABS: 0.1 10*3/uL (ref 0.0–0.5)
EOS%: 1.8 % (ref 0.0–7.0)
HCT: 42 % (ref 38.7–49.9)
HGB: 13.9 g/dL (ref 13.0–17.1)
LYMPH#: 2.7 10*3/uL (ref 0.9–3.3)
LYMPH%: 43 % (ref 14.0–48.0)
MCH: 26.1 pg — AB (ref 28.0–33.4)
MCHC: 33.1 g/dL (ref 32.0–35.9)
MCV: 79 fL — ABNORMAL LOW (ref 82–98)
MONO#: 0.5 10*3/uL (ref 0.1–0.9)
MONO%: 8.3 % (ref 0.0–13.0)
NEUT#: 2.9 10*3/uL (ref 1.5–6.5)
NEUT%: 46.3 % (ref 40.0–80.0)
Platelets: 155 10*3/uL (ref 145–400)
RBC: 5.32 10*6/uL (ref 4.20–5.70)
RDW: 15.3 % (ref 11.1–15.7)
WBC: 6.3 10*3/uL (ref 4.0–10.0)

## 2014-08-20 MED ORDER — FOLIC ACID 1 MG PO TABS: 1.0000 mg | ORAL_TABLET | Freq: Every day | ORAL | Status: DC

## 2014-08-20 NOTE — Progress Notes (Signed)
Hematology and Oncology Follow Up Visit  Mitchell Thornton [id number] 09-24-1955 59 y.o. 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 is back for follow-up. This is a second office visit. We first saw him, we did the hypercoagulable panel. This was fairly unremarkable although his lupus anticoagulant was positive. However, the hexose phosphate neutralization test was negative. 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  We went ahead and do a Doppler today of his left leg. There was flow that was reestablished in the left lower leg. There is still a chronic mural thickening of the vein.  He wants to try to run again. I told him that he could. He must drink a lot of water. He also must wear the compression stocking.  I think that we can probably get away with 6 months of anticoagulation. I think this mural thickening always be present.  Medications:  Current outpatient prescriptions:  .  amLODipine (NORVASC) 5 MG tablet, Take 1 tablet (5 mg total) by mouth daily., Disp: 90 tablet, Rfl: 1 .  latanoprost (XALATAN) 0.005 % ophthalmic solution, Place 1 drop into both eyes at bedtime.  , Disp: , Rfl:  .  mesalamine (LIALDA) 1.2 G EC tablet, Take 1.2 g by mouth 2 (two) times daily. Take 2 tablet by mouth every morning., Disp: , Rfl:  .  rivaroxaban (XARELTO) 20 MG TABS tablet, Take 1 tablet (20 mg total) by mouth daily with supper., Disp: 30 tablet, Rfl: 4 .  folic acid (FOLVITE) 1 MG tablet, Take 1 tablet (1 mg total) by mouth daily., Disp: 30 tablet, Rfl: 12  Allergies: No Known Allergies  Past Medical History, Surgical history, Social history, and Family History were reviewed and updated.  Review of Systems: As  above  Physical Exam:  height is 6' (1.829 m) and weight is 305 lb (138.347 kg). His oral temperature is 97.9 F (36.6 C). His blood pressure is 140/84 and his pulse is 50. His respiration is 18.   Wt Readings from Last 3 Encounters:  08/20/14 305 lb (138.347 kg)  06/22/14 304 lb (137.893 kg)  06/06/14 302 lb 4 oz (137.1 kg)     Obese African-American male in no obvious distress. Head and neck exam shows no ocular or oral lesions. There are no palpable cervical or supraclavicular lymph nodes. Lungs are clear. Cardiac exam regular rate and rhythm with no murmurs, rubs or bruits. Abdomen is soft. He has good bowel sounds. He is obese. There is no fluid wave. There is no palpable liver or spleen tip. Back exam shows no tenderness over the spine, ribs or hips. Extremities shows no clubbing, cyanosis or edema. There may be some slight nonpitting edema of the left lower leg. There is a negative Homans sign for the left lower leg. He has good pulses bilaterally. Skin exam shows no rashes, ecchymoses or petechia. Neurological exam is nonfocal.  Lab Results  Component Value Date   WBC 6.3 08/20/2014   HGB 13.9 08/20/2014   HCT 42.0 08/20/2014   MCV 79* 08/20/2014   PLT 155 08/20/2014     Chemistry      Component Value Date/Time   NA 137 05/27/2014 2134   K 3.7 05/27/2014 2134  CL 107 05/27/2014 2134   CO2 24 05/27/2014 2134   BUN 14 05/27/2014 2134   CREATININE 0.89 05/27/2014 2134      Component Value Date/Time   CALCIUM 8.5 05/27/2014 2134   ALKPHOS 70 06/05/2013 0912   AST 28 06/05/2013 0912   ALT 30 06/05/2013 0912   BILITOT 0.3 06/05/2013 0912         Impression and Plan: [name] is 59 year old African-American male. He has a thrombus in the left calf. This is probably idiopathic. We will repeat the lupus anticoagulant study.  I want to get another Doppler on him in 3 months. At this point time, if everything looks stable or better, then I think we can probably get  him onto low-dose aspirin.  I spent about 25 minutes with him today. I went over his Doppler report. I went over his labs that he is had done.  I'm going to call in some folic acid for him. I think this may be helpful.  I do want to check a hemoglobin electrophoresis. I want to make sure that there is no abnormal hemoglobin.   Josph MachoENNEVER,PETER R, MD 7/18/201611:15 AM

## 2014-08-21 ENCOUNTER — Ambulatory Visit: Payer: BLUE CROSS/BLUE SHIELD | Admitting: Hematology & Oncology

## 2014-08-21 ENCOUNTER — Other Ambulatory Visit: Payer: BLUE CROSS/BLUE SHIELD

## 2014-08-26 LAB — LUPUS ANTICOAGULANT PANEL

## 2014-08-26 LAB — ANTITHROMBIN III: ANTITHROMB III FUNC: 78 % (ref 76–126)

## 2014-08-27 ENCOUNTER — Telehealth: Payer: Self-pay | Admitting: Hematology & Oncology

## 2014-08-27 NOTE — Telephone Encounter (Signed)
LT MESS WITH WIFE REGARDING FUTURE APPT IN 10/27 AT 8:00

## 2014-09-19 ENCOUNTER — Telehealth: Payer: Self-pay | Admitting: Internal Medicine

## 2014-09-19 NOTE — Telephone Encounter (Signed)
pre visit letter mailed 09/13/14 °

## 2014-10-04 ENCOUNTER — Encounter: Payer: BLUE CROSS/BLUE SHIELD | Admitting: Internal Medicine

## 2014-11-14 ENCOUNTER — Telehealth: Payer: Self-pay | Admitting: Hematology & Oncology

## 2014-11-14 NOTE — Telephone Encounter (Signed)
Md will be out of office on 11/29/14.  appt was cx and resch for 12/21/14.  i called and left detailed message on patient's Vm about appt changes and mailed out appt calendar

## 2014-11-26 ENCOUNTER — Other Ambulatory Visit: Payer: Self-pay

## 2014-11-26 MED ORDER — RIVAROXABAN 20 MG PO TABS: 20.0000 mg | ORAL_TABLET | Freq: Every day | ORAL | Status: DC

## 2014-11-29 ENCOUNTER — Ambulatory Visit: Payer: BLUE CROSS/BLUE SHIELD | Admitting: Hematology & Oncology

## 2014-11-29 ENCOUNTER — Other Ambulatory Visit: Payer: BLUE CROSS/BLUE SHIELD

## 2014-12-21 ENCOUNTER — Encounter: Payer: Self-pay | Admitting: Hematology & Oncology

## 2014-12-21 ENCOUNTER — Ambulatory Visit (HOSPITAL_BASED_OUTPATIENT_CLINIC_OR_DEPARTMENT_OTHER): Payer: BLUE CROSS/BLUE SHIELD

## 2014-12-21 ENCOUNTER — Ambulatory Visit (HOSPITAL_BASED_OUTPATIENT_CLINIC_OR_DEPARTMENT_OTHER): Payer: BLUE CROSS/BLUE SHIELD | Admitting: Hematology & Oncology

## 2014-12-21 VITALS — BP 161/94 | HR 75 | Temp 98.1°F | Resp 16 | Ht 72.0 in | Wt 297.0 lb

## 2014-12-21 DIAGNOSIS — I82403 Acute embolism and thrombosis of unspecified deep veins of lower extremity, bilateral: Secondary | ICD-10-CM

## 2014-12-21 DIAGNOSIS — D529 Folate deficiency anemia, unspecified: Secondary | ICD-10-CM | POA: Diagnosis not present

## 2014-12-21 DIAGNOSIS — I82411 Acute embolism and thrombosis of right femoral vein: Secondary | ICD-10-CM

## 2014-12-21 LAB — CBC WITH DIFFERENTIAL (CANCER CENTER ONLY)
BASO#: 0 10*3/uL (ref 0.0–0.2)
BASO%: 0.3 % (ref 0.0–2.0)
EOS%: 1.9 % (ref 0.0–7.0)
Eosinophils Absolute: 0.1 10*3/uL (ref 0.0–0.5)
HCT: 42.2 % (ref 38.7–49.9)
HGB: 14 g/dL (ref 13.0–17.1)
LYMPH#: 2.5 10*3/uL (ref 0.9–3.3)
LYMPH%: 36.5 % (ref 14.0–48.0)
MCH: 26.1 pg — ABNORMAL LOW (ref 28.0–33.4)
MCHC: 33.2 g/dL (ref 32.0–35.9)
MCV: 79 fL — AB (ref 82–98)
MONO#: 0.6 10*3/uL (ref 0.1–0.9)
MONO%: 8.6 % (ref 0.0–13.0)
NEUT#: 3.6 10*3/uL (ref 1.5–6.5)
NEUT%: 52.7 % (ref 40.0–80.0)
PLATELETS: 163 10*3/uL (ref 145–400)
RBC: 5.37 10*6/uL (ref 4.20–5.70)
RDW: 14.6 % (ref 11.1–15.7)
WBC: 6.9 10*3/uL (ref 4.0–10.0)

## 2014-12-21 LAB — CHCC SATELLITE - SMEAR

## 2014-12-21 NOTE — Progress Notes (Signed)
Hematology and Oncology Follow Up Visit  Mitchell Thornton [id number] January 19, 1956 59 y.o. 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 His family lives down there. He went down with a brother and sister. He had a good time.   his left leg is doing all right. He's had no problems with swelling. Is trying to exercise a little bit more.  He has been on Xarelto. We started him on Xarelto I think in April. We can now get him off Xarelto and put him on full dose aspirin.  He's had no cough. There is no shortness of breath.  He has had no change in bowel or bladder habits.   He has had no fever. He's had no headache. He's had no nausea or vomiting.  Overall, his performance status is ECOG 1.    Medications:  Current outpatient prescriptions:  .  amLODipine (NORVASC) 5 MG tablet, Take 1 tablet (5 mg total) by mouth daily., Disp: 90 tablet, Rfl: 1 .  folic acid (FOLVITE) 1 MG tablet, Take 1 tablet (1 mg total) by mouth daily., Disp: 30 tablet, Rfl: 12 .  latanoprost (XALATAN) 0.005 % ophthalmic solution, Place 1 drop into both eyes at bedtime.  , Disp: , Rfl:  .  mesalamine (LIALDA) 1.2 G EC tablet, Take 1.2 g by mouth 2 (two) times daily. Take 2 tablet by mouth every morning., Disp: , Rfl:  .  rivaroxaban (XARELTO) 20 MG TABS tablet, Take 1 tablet (20 mg total) by mouth daily with supper., Disp: 30 tablet, Rfl: 4  Allergies: No Known Allergies  Past Medical History, Surgical history, Social history, and Family History were reviewed and updated.  Review of Systems: As above  Physical Exam:  height is 6' (1.829 m) and weight is 297 lb (134.718 kg). His oral temperature is 98.1 F (36.7 C). His blood pressure is 161/94 and his pulse is 75.  His respiration is 16.   Wt Readings from Last 3 Encounters:  12/21/14 297 lb (134.718 kg)  08/20/14 305 lb (138.347 kg)  06/22/14 304 lb (137.893 kg)     Obese African-American male in no obvious distress. Head and neck exam shows no ocular or oral lesions. There are no palpable cervical or supraclavicular lymph nodes. Lungs are clear. Cardiac exam regular rate and rhythm with no murmurs, rubs or bruits. Abdomen is soft. He has good bowel sounds. He is obese. There is no fluid wave. There is no palpable liver or spleen tip. Back exam shows no tenderness over the spine, ribs or hips. Extremities shows no clubbing, cyanosis or edema. There may be some slight nonpitting edema of the left lower leg. There is a negative Homans sign for the left lower leg. He has good pulses bilaterally. Skin exam shows no rashes, ecchymoses or petechia. Neurological exam is nonfocal.  Lab Results  Component Value Date   WBC 6.9 12/21/2014   HGB 14.0 12/21/2014   HCT 42.2 12/21/2014   MCV 79* 12/21/2014   PLT 163 12/21/2014     Chemistry      Component Value Date/Time   NA 137 05/27/2014 2134   K 3.7 05/27/2014 2134   CL 107 05/27/2014 2134   CO2 24 05/27/2014 2134   BUN 14 05/27/2014 2134  CREATININE 0.89 05/27/2014 2134      Component Value Date/Time   CALCIUM 8.5 05/27/2014 2134   ALKPHOS 70 06/05/2013 0912   AST 28 06/05/2013 0912   ALT 30 06/05/2013 0912   BILITOT 0.3 06/05/2013 0912         Impression and Plan: [name] is 59 year old African-American male.  I think we can get him off the Xarelto now. He's been on it for 7 months.  I think that a full dose aspirin would be beneficial for him area and he does have a positive lupus anticoagulant but this was negative on confirmation. However, I still think that a full dose aspirin would be beneficial.  I want to see him back in 6 months. At that point time, we will repeat a Doppler of his left leg and see how the thrombus  looks.  I told to make sure he takes the aspirin in the morning with food.   I will also check another lupus anticoagulant panel was see him back. Her affect does have a low MCV. I suspect that he probably has alpha thalassemia.   Mitchell Thornton,Mitchell Munar R, MD 11/18/201610:45 AM

## 2014-12-25 LAB — RFLX DRVVT CONFRIM: DRVVT CONFIRMATION: NEGATIVE

## 2014-12-25 LAB — RFX DRVVT SCR W/RFLX CONF 1:1 MIX: dRVVT Screen: 54 s — ABNORMAL HIGH (ref <=45)

## 2014-12-25 LAB — HEMOGLOBINOPATHY EVALUATION
HEMOGLOBIN OTHER: 0 %
HGB A: 97.3 % (ref 96.8–97.8)
Hgb A2 Quant: 2.7 % (ref 2.2–3.2)
Hgb F Quant: 0 % (ref 0.0–2.0)
Hgb S Quant: 0 %

## 2014-12-25 LAB — LUPUS ANTICOAGULANT PANEL

## 2014-12-25 LAB — RFX PTT-LA W/RFX TO HEX PHASE CONF: PTT-LA SCREEN: 38 s (ref <=40)

## 2015-01-23 ENCOUNTER — Other Ambulatory Visit: Payer: Self-pay | Admitting: Internal Medicine

## 2015-02-14 ENCOUNTER — Encounter: Payer: BLUE CROSS/BLUE SHIELD | Admitting: Internal Medicine

## 2015-02-27 ENCOUNTER — Telehealth: Payer: Self-pay | Admitting: Internal Medicine

## 2015-02-27 ENCOUNTER — Encounter: Payer: Self-pay | Admitting: Internal Medicine

## 2015-02-27 NOTE — Telephone Encounter (Signed)
error:[id number] ° °

## 2015-02-27 NOTE — Telephone Encounter (Signed)
Patient physical appointment for 02/14/2015 had to be [facility] due to the provider, patient would like to be seen prior to the next available slot which is May. PCP approved putting 2 same day appointment for 03/07/2015 at 10:30am.

## 2015-02-27 NOTE — Telephone Encounter (Signed)
Noted  

## 2015-03-06 ENCOUNTER — Telehealth: Payer: Self-pay | Admitting: Behavioral Health

## 2015-03-06 NOTE — Telephone Encounter (Signed)
Unable to reach patient at time of Pre-Visit Call.  Left message for patient to return call when available.    

## 2015-03-07 ENCOUNTER — Encounter: Payer: Self-pay | Admitting: Internal Medicine

## 2015-03-07 ENCOUNTER — Ambulatory Visit (INDEPENDENT_AMBULATORY_CARE_PROVIDER_SITE_OTHER): Payer: BLUE CROSS/BLUE SHIELD | Admitting: Internal Medicine

## 2015-03-07 ENCOUNTER — Other Ambulatory Visit: Payer: Self-pay

## 2015-03-07 VITALS — BP 126/74 | HR 53 | Temp 97.6°F | Ht 72.0 in | Wt 302.5 lb

## 2015-03-07 DIAGNOSIS — Z125 Encounter for screening for malignant neoplasm of prostate: Secondary | ICD-10-CM | POA: Diagnosis not present

## 2015-03-07 DIAGNOSIS — K649 Unspecified hemorrhoids: Secondary | ICD-10-CM

## 2015-03-07 DIAGNOSIS — Z114 Encounter for screening for human immunodeficiency virus [HIV]: Secondary | ICD-10-CM

## 2015-03-07 DIAGNOSIS — Z0000 Encounter for general adult medical examination without abnormal findings: Secondary | ICD-10-CM | POA: Diagnosis not present

## 2015-03-07 LAB — URINALYSIS, ROUTINE W REFLEX MICROSCOPIC
BILIRUBIN URINE: NEGATIVE
HGB URINE DIPSTICK: NEGATIVE
Ketones, ur: NEGATIVE
LEUKOCYTES UA: NEGATIVE
NITRITE: NEGATIVE
RBC / HPF: NONE SEEN (ref 0–?)
Specific Gravity, Urine: 1.015 (ref 1.000–1.030)
Total Protein, Urine: NEGATIVE
Urine Glucose: NEGATIVE
Urobilinogen, UA: 0.2 (ref 0.0–1.0)
WBC, UA: NONE SEEN (ref 0–?)
pH: 6.5 (ref 5.0–8.0)

## 2015-03-07 LAB — TSH: TSH: 1.29 u[IU]/mL (ref 0.35–4.50)

## 2015-03-07 LAB — COMPLETE METABOLIC PANEL WITH GFR
ALT: 20 U/L (ref 9–46)
AST: 23 U/L (ref 10–35)
Albumin: 3.9 g/dL (ref 3.6–5.1)
Alkaline Phosphatase: 78 U/L (ref 40–115)
BILIRUBIN TOTAL: 0.5 mg/dL (ref 0.2–1.2)
BUN: 7 mg/dL (ref 7–25)
CALCIUM: 8.7 mg/dL (ref 8.6–10.3)
CHLORIDE: 101 mmol/L (ref 98–110)
CO2: 26 mmol/L (ref 20–31)
CREATININE: 0.92 mg/dL (ref 0.70–1.33)
GFR, Est African American: >89 mL/min (ref >=60)
GFR, Est Non African American: >89 mL/min (ref >=60)
GLUCOSE: 89 mg/dL (ref 65–99)
Potassium: 4.2 mmol/L (ref 3.5–5.3)
Sodium: 135 mmol/L (ref 135–146)
TOTAL PROTEIN: 7 g/dL (ref 6.1–8.1)

## 2015-03-07 LAB — LIPID PANEL
CHOL/HDL RATIO: 3
Cholesterol: 140 mg/dL (ref 0–200)
HDL: 45.7 mg/dL (ref >39.00)
LDL Cholesterol: 86 mg/dL (ref 0–99)
NONHDL: 94.6
Triglycerides: 42 mg/dL (ref 0.0–149.0)
VLDL: 8.4 mg/dL (ref 0.0–40.0)

## 2015-03-07 LAB — HIV ANTIBODY (ROUTINE TESTING W REFLEX): HIV 1&2 Ab, 4th Generation: NONREACTIVE

## 2015-03-07 LAB — PSA: PSA: 1.04 ng/mL (ref 0.10–4.00)

## 2015-03-07 LAB — HEMOGLOBIN A1C: Hgb A1c MFr Bld: 5.9 % (ref 4.6–6.5)

## 2015-03-07 MED ORDER — LIDOCAINE 5 % EX OINT: 1.0000 "application " | TOPICAL_OINTMENT | CUTANEOUS | Status: DC | PRN

## 2015-03-07 NOTE — Progress Notes (Signed)
Pre visit review using our clinic review tool, if applicable. No additional management support is needed unless otherwise documented below in the visit note. 

## 2015-03-07 NOTE — Patient Instructions (Addendum)
GO TO THE LAB : Get the blood work    GO TO THE FRONT DESK Schedule a routine office visit or check up to be done in  6 months  No fasting     Continue using the suppositories Use the lidocaine ointment as needed for pain.

## 2015-03-07 NOTE — Progress Notes (Signed)
Subjective:    Patient ID: Mitchell Thornton, male    DOB: 10-07-1955, 60 y.o.   MRN: [id number]  DOS:  03/07/2015 Type of visit - description : CPX Interval history: Having problems with hemorrhoids, back in December was seen at urgent care, a ointment was prescribed. Few days ago went to the ER with rectal pain but no bleeding. Dx w/  external hemorrhoids, DRE not performed due to pain, he is using suppositories and is better. No pain, no bleeding.    Review of Systems Constitutional: No fever. No chills. No unexplained wt changes. No unusual sweats  HEENT: No dental problems, no ear discharge, no facial swelling, no voice changes. No eye discharge, no eye  redness , no  intolerance to light  Dry mouth at night during the wintertime, he thinks related to the air conditioning Respiratory: No wheezing , no  difficulty breathing. No cough , no mucus production  Cardiovascular: No CP, no leg swelling , no  Palpitations  GI: no nausea, no vomiting, no diarrhea , no  abdominal pain.  No blood in the stools. No dysphagia, no odynophagia    Endocrine: No polyphagia, no polyuria , no polydipsia  GU: No dysuria, gross hematuria, difficulty urinating. No urinary urgency, no frequency. + Nocturia for the last 3 years, goes to the bathroom 3 or 4 / nightay, admits that he drinks plenty of fluids during the daytime and before bedtime  Musculoskeletal: No joint swellings or unusual aches or pains  Skin: No change in the color of the skin, palor , no  Rash  Allergic, immunologic: No environmental allergies , no  food allergies  Neurological: No dizziness no  syncope. No headaches. No diplopia, no slurred, no slurred speech, no motor deficits, no facial  Numbness  Hematological: No enlarged lymph nodes, no easy bruising , no unusual bleedings  Psychiatry: No suicidal ideas, no hallucinations, no beavior problems, no confusion.  No unusual/severe anxiety, no depression   Past Medical History   Diagnosis Date  . Colitis     Cscope ~ 2007, L sided, Rx lialda  . Glaucoma     Past Surgical History  Procedure Laterality Date  . Cystectomy      from leg     Social History   Social History  . Marital Status: Married    Spouse Name: N/A  . Number of Children: 3  . 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 Smoking status: Never Smoker   . Smokeless tobacco: Never Used     Comment: NEVER USED TOBACCO  . Alcohol Use: No  . Drug Use: No  . Sexual Activity: Not on file   Other Topics Concern  . Not on file   Social History Narrative   Original from [place], moved to the [place] in 1982, moved to [place] ~ 1995   Household- pt, wife, 1 son                    Family History  Problem Relation Age of Onset  . Coronary artery disease Neg Hx   . Diabetes Mother   . Stroke Neg Hx   . Deep vein thrombosis Mother     "clots" mother   . Hypertension Mother   . Prostate cancer Father 38  . Colon cancer Neg Hx   . Aneurysm Father     brain aneurysm  Medication List       This list is accurate as of: 03/07/15  8:07 PM.  Always use your most recent med list.               amLODipine 5 MG tablet  Commonly known as:  NORVASC  Take 1 tablet (5 mg total) by mouth daily.     ANUCORT-HC 25 MG suppository  Generic drug:  hydrocortisone  Place 1 suppository rectally 2 (two) times daily as needed.     aspirin 325 MG tablet  Take 325 mg by mouth daily.     folic acid 1 MG tablet  Commonly known as:  FOLVITE  Take 1 tablet (1 mg total) by mouth daily.     latanoprost 0.005 % ophthalmic solution  Commonly known as:  XALATAN  Place 1 drop into both eyes at bedtime.     lidocaine 5 % ointment  Commonly known as:  XYLOCAINE  Apply 1 application topically as needed.     mesalamine 1.2 g EC tablet  Commonly known as:  LIALDA  Take 1.2 g by mouth 2 (two) times daily. Take 2 tablet by mouth every  morning.     naproxen 500 MG tablet  Commonly known as:  NAPROSYN  Take 1 tablet by mouth 2 (two) times daily with a meal. Reported on 03/07/2015     STOOL SOFTENER 100 MG capsule  Generic drug:  docusate sodium  Take 1 capsule by mouth every 12 (twelve) hours as needed. Reported on 03/07/2015           Objective:   Physical Exam BP 126/74 mmHg  Pulse 53  Temp(Src) 97.6 F (36.4 C) (Oral)  Ht 6' (1.829 m)  Wt 302 lb 8 oz (137.213 kg)  BMI 41.02 kg/m2  SpO2 98% General:   Well developed, well nourished . NAD.  HEENT:  Normocephalic . Face symmetric, atraumatic. No thyromegaly Lungs:  CTA B Normal respiratory effort, no intercostal retractions, no accessory muscle use. Heart: RRR,  no murmur.  no pretibial edema bilaterally  Abdomen:  Not distended, soft, non-tender. No rebound or rigidity.  DRE: Has 3 external hemorrhoids, largest  one is about 1 cm, no TTP, no fluctuant, slightly hard. Prostate exam limited but and is not tender, slightly enlarged gland Anoscopy: Quite limited due to discomfort, I was able to see few internal hemorrhoids. Skin: Not pale. Not jaundice Neurologic:  alert & oriented X3.  Speech normal, gait appropriate for age and unassisted Psych--  Cognition and judgment appear intact.  Cooperative with normal attention span and concentration.  Behavior appropriate. No anxious or depressed appearing.    Assessment & Plan:   Assessment HTN Morbid obesity Colitis, left-sided, on Lialda,  Cscope ~ 2007 Dr Matthias Hughs, saw Dr Marcelene Butte 2016, but he retired  Ext and int hemorrhoids, anoscopy 03-06-15 Glaucoma DVT 05-2014 >> + lupus anticoagulant but  negative hexose phosphate test >> saw hematology: likely   idiopathic thrombus , DC  xarelto 12-2014, plan to  repeat US 6 months, ASA qd  Hemorrhoids   PLAN: HTN: Continue amlodipine, BP today is very good History of colitis and now hemorrhoids: Continue suppositories as needed, prescribed lidocaine for  when necessary use. Referred to GI @ [place] (Dr Marcelene Butte retired per pt, likes to switch to another group) DVT: Off Xarelto, plan is to repeat ultrasound by June. Follow-up by hematology. RTC 6 months

## 2015-03-07 NOTE — Assessment & Plan Note (Signed)
Td  2009; declined a flu shot.   Last Cscope 9-12, next 2017  DRE today limited, he does have nocturia. Check a UA and PSA. Tried to minimize fluids at bedtime. Diet, exercise discussed Labs  : CMP, FLP, A1c, TSH, PSA, UA, urine culture, HIV

## 2015-03-08 LAB — URINE CULTURE
COLONY COUNT: NO GROWTH
ORGANISM ID, BACTERIA: NO GROWTH

## 2015-04-23 ENCOUNTER — Other Ambulatory Visit: Payer: Self-pay

## 2015-04-23 MED ORDER — AMLODIPINE BESYLATE 5 MG PO TABS: 5.0000 mg | ORAL_TABLET | Freq: Every day | ORAL | Status: DC

## 2015-06-18 ENCOUNTER — Other Ambulatory Visit (HOSPITAL_BASED_OUTPATIENT_CLINIC_OR_DEPARTMENT_OTHER): Payer: BLUE CROSS/BLUE SHIELD

## 2015-06-18 ENCOUNTER — Other Ambulatory Visit: Payer: BLUE CROSS/BLUE SHIELD

## 2015-06-18 ENCOUNTER — Encounter: Payer: Self-pay | Admitting: Hematology & Oncology

## 2015-06-18 ENCOUNTER — Ambulatory Visit (HOSPITAL_BASED_OUTPATIENT_CLINIC_OR_DEPARTMENT_OTHER): Payer: BLUE CROSS/BLUE SHIELD | Admitting: Hematology & Oncology

## 2015-06-18 ENCOUNTER — Ambulatory Visit: Payer: BLUE CROSS/BLUE SHIELD | Admitting: Hematology & Oncology

## 2015-06-18 ENCOUNTER — Ambulatory Visit (HOSPITAL_BASED_OUTPATIENT_CLINIC_OR_DEPARTMENT_OTHER)
Admission: RE | Admit: 2015-06-18 | Discharge: 2015-06-18 | Disposition: A | Discharge status: Home / Self Care | Payer: BLUE CROSS/BLUE SHIELD | Source: Ambulatory Visit | Attending: Hematology & Oncology | Admitting: Hematology & Oncology

## 2015-06-18 VITALS — BP 130/76 | HR 66 | Temp 98.9°F | Resp 18 | Ht 72.0 in | Wt 295.0 lb

## 2015-06-18 DIAGNOSIS — I82402 Acute embolism and thrombosis of unspecified deep veins of left lower extremity: Secondary | ICD-10-CM

## 2015-06-18 DIAGNOSIS — I82592 Chronic embolism and thrombosis of other specified deep vein of left lower extremity: Secondary | ICD-10-CM | POA: Diagnosis not present

## 2015-06-18 DIAGNOSIS — I82411 Acute embolism and thrombosis of right femoral vein: Secondary | ICD-10-CM | POA: Insufficient documentation

## 2015-06-18 DIAGNOSIS — Z7982 Long term (current) use of aspirin: Secondary | ICD-10-CM

## 2015-06-18 LAB — CBC WITH DIFFERENTIAL (CANCER CENTER ONLY)
BASO#: 0 10*3/uL (ref 0.0–0.2)
BASO%: 0.3 % (ref 0.0–2.0)
EOS ABS: 0.2 10*3/uL (ref 0.0–0.5)
EOS%: 2 % (ref 0.0–7.0)
HCT: 41.5 % (ref 38.7–49.9)
HGB: 13.9 g/dL (ref 13.0–17.1)
LYMPH#: 2.3 10*3/uL (ref 0.9–3.3)
LYMPH%: 22.1 % (ref 14.0–48.0)
MCH: 26.6 pg — ABNORMAL LOW (ref 28.0–33.4)
MCHC: 33.5 g/dL (ref 32.0–35.9)
MCV: 79 fL — AB (ref 82–98)
MONO#: 0.8 10*3/uL (ref 0.1–0.9)
MONO%: 7.9 % (ref 0.0–13.0)
NEUT#: 7 10*3/uL — ABNORMAL HIGH (ref 1.5–6.5)
NEUT%: 67.7 % (ref 40.0–80.0)
PLATELETS: 187 10*3/uL (ref 145–400)
RBC: 5.23 10*6/uL (ref 4.20–5.70)
RDW: 14.9 % (ref 11.1–15.7)
WBC: 10.4 10*3/uL — AB (ref 4.0–10.0)

## 2015-06-18 NOTE — Progress Notes (Signed)
Hematology and Oncology Follow Up Visit  Mitchell Thornton [id number] [birth year] 60 y.o. 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 He is doing well. He now is on aspirin. He's had no problem with the aspirin. He takes it with food. He's taking coated aspirin.  We did go ahead and get a Doppler of his left leg. The clot is almost all gone now. There is good blood flow through the area of the thrombus.  He's had no cough or shortness of breath. Has been no chest wall pain.  He's had no change in bowel or bladder habits.  He's not noted any swelling in the legs.   Overall, his performance status is ECOG 1.    Medications:  Current outpatient prescriptions:  .  amLODipine (NORVASC) 5 MG tablet, Take 1 tablet (5 mg total) by mouth daily., Disp: 90 tablet, Rfl: 2 .  ANUCORT-HC 25 MG suppository, Place 1 suppository rectally 2 (two) times daily as needed., Disp: , Rfl: 0 .  aspirin 325 MG tablet, Take 325 mg by mouth daily., Disp: , Rfl:  .  folic acid (FOLVITE) 1 MG tablet, Take 1 tablet (1 mg total) by mouth daily., Disp: 30 tablet, Rfl: 12 .  latanoprost (XALATAN) 0.005 % ophthalmic solution, Place 1 drop into both eyes at bedtime.  , Disp: , Rfl:  .  lidocaine (XYLOCAINE) 5 % ointment, Apply 1 application topically as needed., Disp: 35.44 g, Rfl: 0 .  mesalamine (LIALDA) 1.2 G EC tablet, Take 1.2 g by mouth 2 (two) times daily. Take 2 tablet by mouth every morning., Disp: , Rfl:  .  naproxen (NAPROSYN) 500 MG tablet, Take 1 tablet by mouth 2 (two) times daily with a meal. Reported on 03/07/2015, Disp: , Rfl: 0 .  STOOL SOFTENER 100 MG capsule, Take 1 capsule by mouth every 12 (twelve) hours as needed. Reported on 03/07/2015, Disp: , Rfl: 0  Allergies: No Known  Allergies  Past Medical History, Surgical history, Social history, and Family History were reviewed and updated.  Review of Systems: As above  Physical Exam:  height is 6' (1.829 m) and weight is 295 lb (133.811 kg). His oral temperature is 98.9 F (37.2 C). His blood pressure is 130/76 and his pulse is 66. His respiration is 18.   Wt Readings from Last 3 Encounters:  06/18/15 295 lb (133.811 kg)  03/07/15 302 lb 8 oz (137.213 kg)  12/21/14 297 lb (134.718 kg)     Obese African-American male in no obvious distress. Head and neck exam shows no ocular or oral lesions. There are no palpable cervical or supraclavicular lymph nodes. Lungs are clear. Cardiac exam regular rate and rhythm with no murmurs, rubs or bruits. Abdomen is soft. He has good bowel sounds. He is obese. There is no fluid wave. There is no palpable liver or spleen tip. Back exam shows no tenderness over the spine, ribs or hips. Extremities shows no clubbing, cyanosis or edema. There may be some slight nonpitting edema of the left lower leg. There is a negative Homans sign for the left lower leg. He has good pulses bilaterally. Skin exam shows no rashes, ecchymoses or petechia. Neurological exam is nonfocal.  Lab Results  Component Value Date   WBC 10.4* 06/18/2015   HGB 13.9  06/18/2015   HCT 41.5 06/18/2015   MCV 79* 06/18/2015   PLT 187 06/18/2015     Chemistry      Component Value Date/Time   NA 135 03/07/2015 1114   K 4.2 03/07/2015 1114   CL 101 03/07/2015 1114   CO2 26 03/07/2015 1114   BUN 7 03/07/2015 1114   CREATININE 0.92 03/07/2015 1114   CREATININE 0.89 05/27/2014 2134      Component Value Date/Time   CALCIUM 8.7 03/07/2015 1114   ALKPHOS 78 03/07/2015 1114   AST 23 03/07/2015 1114   ALT 20 03/07/2015 1114   BILITOT 0.5 03/07/2015 1114         Impression and Plan: Mitchell Thornton is 60 year old African-American male.  At this point, he is doing quite well. He really has had no problems with  respect to the thrombus. He is on aspirin. The Doppler is reassuring that his clot is resolving.  I don't think we have to get him back to see us. I'm not sure that we are adding much to his medical care right now.  [place]. His son is going to [employer]. I told him to make sure that when he tries, that he stops every couple hours to walk around. I also told to make sure he drink a lot of water.   I told him that if he does have any problems with his legs, or if he is worried about a blood clot recurring, that he can call us and we can always get him in.     Josph MachoENNEVER,Viktoria Gruetzmacher R, MD 5/16/201711:57 AM

## 2015-06-19 LAB — LUPUS ANTICOAGULANT PANEL
DRVVT MIX: 45.2 s (ref 0.0–47.0)
PTT-LA: 37.6 s (ref 0.0–43.6)
dRVVT: 50.5 s — ABNORMAL HIGH (ref 0.0–47.0)

## 2015-06-19 LAB — D-DIMER, QUANTITATIVE: D-DIMER: 1.66 mg/L FEU — ABNORMAL HIGH (ref 0.00–0.49)

## 2015-09-04 ENCOUNTER — Ambulatory Visit: Payer: BLUE CROSS/BLUE SHIELD | Admitting: Internal Medicine

## 2015-09-11 ENCOUNTER — Other Ambulatory Visit: Payer: Self-pay | Admitting: Hematology & Oncology

## 2015-09-11 DIAGNOSIS — D529 Folate deficiency anemia, unspecified: Secondary | ICD-10-CM

## 2015-09-11 DIAGNOSIS — I82403 Acute embolism and thrombosis of unspecified deep veins of lower extremity, bilateral: Secondary | ICD-10-CM

## 2015-10-11 ENCOUNTER — Other Ambulatory Visit: Payer: Self-pay | Admitting: Hematology & Oncology

## 2015-10-11 DIAGNOSIS — I82403 Acute embolism and thrombosis of unspecified deep veins of lower extremity, bilateral: Secondary | ICD-10-CM

## 2015-10-11 DIAGNOSIS — D529 Folate deficiency anemia, unspecified: Secondary | ICD-10-CM

## 2015-11-10 ENCOUNTER — Other Ambulatory Visit: Payer: Self-pay | Admitting: Hematology & Oncology

## 2015-11-10 DIAGNOSIS — D529 Folate deficiency anemia, unspecified: Secondary | ICD-10-CM

## 2015-11-25 DIAGNOSIS — L089 Local infection of the skin and subcutaneous tissue, unspecified: Secondary | ICD-10-CM | POA: Diagnosis not present

## 2015-11-25 DIAGNOSIS — W57XXXA Bitten or stung by nonvenomous insect and other nonvenomous arthropods, initial encounter: Secondary | ICD-10-CM | POA: Diagnosis not present

## 2015-11-28 DIAGNOSIS — K5190 Ulcerative colitis, unspecified, without complications: Secondary | ICD-10-CM | POA: Diagnosis not present

## 2015-12-08 ENCOUNTER — Other Ambulatory Visit: Payer: Self-pay | Admitting: Hematology & Oncology

## 2015-12-08 DIAGNOSIS — D529 Folate deficiency anemia, unspecified: Secondary | ICD-10-CM

## 2016-01-07 ENCOUNTER — Other Ambulatory Visit: Payer: Self-pay | Admitting: Hematology & Oncology

## 2016-01-07 DIAGNOSIS — D529 Folate deficiency anemia, unspecified: Secondary | ICD-10-CM

## 2016-01-09 ENCOUNTER — Other Ambulatory Visit: Payer: Self-pay | Admitting: Internal Medicine

## 2016-01-21 DIAGNOSIS — K5190 Ulcerative colitis, unspecified, without complications: Secondary | ICD-10-CM | POA: Diagnosis not present

## 2016-01-21 DIAGNOSIS — K529 Noninfective gastroenteritis and colitis, unspecified: Secondary | ICD-10-CM | POA: Diagnosis not present

## 2016-01-21 DIAGNOSIS — K648 Other hemorrhoids: Secondary | ICD-10-CM | POA: Diagnosis not present

## 2016-01-21 DIAGNOSIS — Z1211 Encounter for screening for malignant neoplasm of colon: Secondary | ICD-10-CM | POA: Diagnosis not present

## 2016-01-21 LAB — HM COLONOSCOPY

## 2016-01-30 DIAGNOSIS — H401132 Primary open-angle glaucoma, bilateral, moderate stage: Secondary | ICD-10-CM | POA: Diagnosis not present

## 2016-02-06 ENCOUNTER — Other Ambulatory Visit: Payer: Self-pay | Admitting: Hematology & Oncology

## 2016-02-06 DIAGNOSIS — D529 Folate deficiency anemia, unspecified: Secondary | ICD-10-CM

## 2016-03-03 ENCOUNTER — Ambulatory Visit (HOSPITAL_BASED_OUTPATIENT_CLINIC_OR_DEPARTMENT_OTHER)
Admission: RE | Admit: 2016-03-03 | Discharge: 2016-03-03 | Disposition: A | Discharge status: Home / Self Care | Payer: BLUE CROSS/BLUE SHIELD | Source: Ambulatory Visit | Attending: Internal Medicine | Admitting: Internal Medicine

## 2016-03-03 ENCOUNTER — Encounter: Payer: Self-pay | Admitting: Internal Medicine

## 2016-03-03 ENCOUNTER — Ambulatory Visit (INDEPENDENT_AMBULATORY_CARE_PROVIDER_SITE_OTHER): Payer: BLUE CROSS/BLUE SHIELD | Admitting: Internal Medicine

## 2016-03-03 VITALS — BP 124/74 | HR 73 | Temp 98.3°F | Resp 14 | Ht 72.0 in | Wt 307.0 lb

## 2016-03-03 DIAGNOSIS — I824Z1 Acute embolism and thrombosis of unspecified deep veins of right distal lower extremity: Secondary | ICD-10-CM | POA: Diagnosis not present

## 2016-03-03 DIAGNOSIS — I82401 Acute embolism and thrombosis of unspecified deep veins of right lower extremity: Secondary | ICD-10-CM | POA: Diagnosis not present

## 2016-03-03 DIAGNOSIS — R600 Localized edema: Secondary | ICD-10-CM | POA: Insufficient documentation

## 2016-03-03 DIAGNOSIS — M722 Plantar fascial fibromatosis: Secondary | ICD-10-CM

## 2016-03-03 DIAGNOSIS — I824Y1 Acute embolism and thrombosis of unspecified deep veins of right proximal lower extremity: Secondary | ICD-10-CM

## 2016-03-03 MED ORDER — RIVAROXABAN (XARELTO) EDUCATION KIT FOR DVT/PE PATIENTS: PACK | 0 refills | Status: DC

## 2016-03-03 MED ORDER — RIVAROXABAN 20 MG PO TABS: 20.0000 mg | ORAL_TABLET | Freq: Every day | ORAL | 2 refills | Status: DC

## 2016-03-03 NOTE — Progress Notes (Signed)
Subjective:    Patient ID: Mitchell Thornton, male    DOB: 12-Sep-1955, 61 y.o.   MRN: [id number]  DOS:  03/03/2016 Type of visit - description : Acute visit Interval history: Developed right heel pain few days ago after he started to use a treadmill. Reports no stretching prior to exercise. But he is here mostly because he also has some swelling and is concerned about a DVT. Denies any pain at the actual calf.    Review of Systems  No fever chills no chest pain, difficulty breathing, no recent airplane trips or prolonged immobilizations.  Past Medical History:  Diagnosis Date  . Colitis    Cscope ~ 2007, L sided, Rx lialda  . Glaucoma     Past Surgical History:  Procedure Laterality Date  . CYSTECTOMY     from leg     Social History   Social History  . Marital status: Married    Spouse name: N/A  . Number of children: 3  . 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 Smoking status: Never Smoker  . Smokeless tobacco: Never Used     Comment: NEVER USED TOBACCO  . Alcohol use No  . Drug use: No  . Sexual activity: Not on file   Other Topics Concern  . Not on file   Social History Narrative   Original from [place], moved to the [place] in 1982, moved to [place] ~ [place]- pt, wife, 1 son                     Allergies as of 03/03/2016   No Known Allergies     Medication List       Accurate as of 03/03/16 11:59 PM. Always use your most recent med list.          amLODipine 5 MG tablet Commonly known as:  NORVASC Take 1 tablet (5 mg total) by mouth daily.   ANUCORT-HC 25 MG suppository Generic drug:  hydrocortisone Place 1 suppository rectally 2 (two) times daily as needed.   folic acid 1 MG tablet Commonly known as:  FOLVITE TAKE 1 TABLET(1 MG) BY MOUTH DAILY   latanoprost 0.005 % ophthalmic solution Commonly known as:  XALATAN Place 1 drop into both eyes at bedtime.     lidocaine 5 % ointment Commonly known as:  XYLOCAINE Apply 1 application topically as needed.   mesalamine 1.2 g EC tablet Commonly known as:  LIALDA Take 1.2 g by mouth 2 (two) times daily. Take 2 tablet by mouth every morning.   naproxen 500 MG tablet Commonly known as:  NAPROSYN Take 1 tablet by mouth 2 (two) times daily with a meal. Reported on 03/07/2015   rivaroxaban 20 MG Tabs tablet Commonly known as:  XARELTO Take 1 tablet (20 mg total) by mouth daily with supper.   rivaroxaban Kit Commonly known as:  XARELTO Take as directed.   STOOL SOFTENER 100 MG capsule Generic drug:  docusate sodium Take 1 capsule by mouth every 12 (twelve) hours as needed. Reported on 03/07/2015          Objective:   Physical Exam BP 124/74 (BP Location: Left Arm, Patient Position: Sitting, Cuff Size: Normal)   Pulse 73   Temp 98.3 F (36.8 C) (Oral)   Resp 14   Ht 6' (1.829 m)   Wt (!) 307 lb (139.3 kg)  SpO2 99%   BMI 41.64 kg/m  General:   Well developed, well nourished . NAD.  HEENT:  Normocephalic . Face symmetric, atraumatic Lower extremities: Left leg normal except for trace pretibial edema Right leg: Edema is more noticeable compared to the left, goes from the ankle to up to mid pretibial area  Skin is not red or warm Minimal TTP at the plantar aspect of the heel Calf itself is symmetric and not tender however the distal leg is 1 cm larger in circumference. Skin: Not pale. Not jaundice Neurologic:  alert & oriented X3.  Speech normal, gait appropriate for age and unassisted Psych--  Cognition and judgment appear intact.  Cooperative with normal attention span and concentration.  Behavior appropriate. No anxious or depressed appearing.      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 Recommend ultrasound to be sure. Addendum: [place] + for DVT below the knee. Patient was the office, discussed the dx and implications. Recommend Xarelto 15 twice a day, for 3 weeks then 20 mg daily. Knows to check for bleeding. Reassess when he comes back. Most likely will need lifelong anticoagulation, will consider hematology referral . RTC within 4-8 weeks for a CPX.

## 2016-03-03 NOTE — Progress Notes (Signed)
Pre visit review using our clinic review tool, if applicable. No additional management support is needed unless otherwise documented below in the visit note. 

## 2016-03-03 NOTE — Patient Instructions (Addendum)
  You are due for a physical exam, please schedule a physical  fasting, within the next 4-8 weeks   Please go downstairs to get the ultrasound of your right leg  ====================  Xarelto 15 mg twice a day for 3  weeks Then Xarelto 20 mg one tablet daily  Watch for bleeding  Keep  the leg elevated

## 2016-03-04 DIAGNOSIS — Z09 Encounter for follow-up examination after completed treatment for conditions other than malignant neoplasm: Secondary | ICD-10-CM | POA: Insufficient documentation

## 2016-03-04 NOTE — Assessment & Plan Note (Signed)
PLAN: Heel pain: Likely mild plantar fasciitis, stretching discussed Right leg edema: only distal from the calf but he is high risk for DVTs due to PMH. Recommend ultrasound to be sure. Addendum: US + for DVT below the knee. Patient was the office, discussed the dx and implications. Recommend Xarelto 15 twice a day, for 3 weeks then 20 mg daily. Knows to check for bleeding. Reassess when he comes back. Most likely will need lifelong anticoagulation, will consider hematology referral . RTC within 4-8 weeks for a CPX.

## 2016-03-09 ENCOUNTER — Other Ambulatory Visit: Payer: Self-pay | Admitting: Hematology & Oncology

## 2016-03-09 DIAGNOSIS — D529 Folate deficiency anemia, unspecified: Secondary | ICD-10-CM

## 2016-04-01 DIAGNOSIS — H401132 Primary open-angle glaucoma, bilateral, moderate stage: Secondary | ICD-10-CM | POA: Diagnosis not present

## 2016-04-06 ENCOUNTER — Other Ambulatory Visit: Payer: Self-pay | Admitting: Internal Medicine

## 2016-04-09 ENCOUNTER — Other Ambulatory Visit: Payer: Self-pay | Admitting: Hematology & Oncology

## 2016-04-09 DIAGNOSIS — D529 Folate deficiency anemia, unspecified: Secondary | ICD-10-CM

## 2016-05-07 ENCOUNTER — Encounter: Payer: Self-pay | Admitting: Internal Medicine

## 2016-05-07 ENCOUNTER — Ambulatory Visit (INDEPENDENT_AMBULATORY_CARE_PROVIDER_SITE_OTHER): Payer: BLUE CROSS/BLUE SHIELD | Admitting: Internal Medicine

## 2016-05-07 ENCOUNTER — Telehealth: Payer: Self-pay

## 2016-05-07 VITALS — BP 128/84 | HR 58 | Temp 98.0°F | Resp 14 | Ht 72.0 in | Wt 306.5 lb

## 2016-05-07 DIAGNOSIS — I82402 Acute embolism and thrombosis of unspecified deep veins of left lower extremity: Secondary | ICD-10-CM

## 2016-05-07 DIAGNOSIS — Z1159 Encounter for screening for other viral diseases: Secondary | ICD-10-CM

## 2016-05-07 DIAGNOSIS — Z0000 Encounter for general adult medical examination without abnormal findings: Secondary | ICD-10-CM | POA: Diagnosis not present

## 2016-05-07 LAB — BASIC METABOLIC PANEL
BUN: 11 mg/dL (ref 6–23)
CHLORIDE: 104 meq/L (ref 96–112)
CO2: 28 meq/L (ref 19–32)
CREATININE: 0.88 mg/dL (ref 0.40–1.50)
Calcium: 9 mg/dL (ref 8.4–10.5)
GFR: 113.99 mL/min (ref >60.00)
Glucose, Bld: 100 mg/dL — ABNORMAL HIGH (ref 70–99)
POTASSIUM: 4.1 meq/L (ref 3.5–5.1)
Sodium: 136 mEq/L (ref 135–145)

## 2016-05-07 LAB — CBC WITH DIFFERENTIAL/PLATELET
BASOS ABS: 0 10*3/uL (ref 0.0–0.1)
BASOS PCT: 1.1 % (ref 0.0–3.0)
EOS ABS: 0.1 10*3/uL (ref 0.0–0.7)
Eosinophils Relative: 1.7 % (ref 0.0–5.0)
HEMATOCRIT: 43 % (ref 39.0–52.0)
Hemoglobin: 14.2 g/dL (ref 13.0–17.0)
LYMPHS PCT: 49.6 % — AB (ref 12.0–46.0)
Lymphs Abs: 2.3 10*3/uL (ref 0.7–4.0)
MCHC: 33.1 g/dL (ref 30.0–36.0)
MCV: 79.1 fl (ref 78.0–100.0)
Monocytes Absolute: 0.4 10*3/uL (ref 0.1–1.0)
Monocytes Relative: 8.3 % (ref 3.0–12.0)
NEUTROS ABS: 1.8 10*3/uL (ref 1.4–7.7)
NEUTROS PCT: 39.3 % — AB (ref 43.0–77.0)
PLATELETS: 163 10*3/uL (ref 150.0–400.0)
RBC: 5.43 Mil/uL (ref 4.22–5.81)
RDW: 14.8 % (ref 11.5–15.5)
WBC: 4.6 10*3/uL (ref 4.0–10.5)

## 2016-05-07 LAB — PSA: PSA: 1.23 ng/mL (ref 0.10–4.00)

## 2016-05-07 LAB — LIPID PANEL
CHOL/HDL RATIO: 3
Cholesterol: 147 mg/dL (ref 0–200)
HDL: 42.5 mg/dL (ref >39.00)
LDL CALC: 95 mg/dL (ref 0–99)
NonHDL: 104.65
TRIGLYCERIDES: 47 mg/dL (ref 0.0–149.0)
VLDL: 9.4 mg/dL (ref 0.0–40.0)

## 2016-05-07 LAB — HEMOGLOBIN A1C: HEMOGLOBIN A1C: 5.9 % (ref 4.6–6.5)

## 2016-05-07 NOTE — Assessment & Plan Note (Signed)
Td  2009  -Last Cscope 9-12, reports Cscope 01-2016, will get records   -- prostate ca screening 2017: DRE limited,  PSA wnl. --Diet, exercise discussed --Labs: BMP, FLP, CBC, A1c, homocysteine level, hep C. Patient likes a PSA check. Will do

## 2016-05-07 NOTE — Progress Notes (Signed)
Pre visit review using our clinic review tool, if applicable. No additional management support is needed unless otherwise documented below in the visit note. 

## 2016-05-07 NOTE — Patient Instructions (Signed)
GO TO THE LAB : Get the blood work     GO TO THE FRONT DESK Schedule your next appointment for a  Physical exam in 1 year    Check the  blood pressure  Weekly  Be sure your blood pressure is between 110/65 and  140/85. If it is consistently higher or lower, let me know   Continue Xarelto, will refer you to the hematologist  Tylenol  500 mg OTC 2 tabs a day every 8 hours as needed for pain  Avoid ibuprofen-naproxen-"BCs" or similar medications

## 2016-05-07 NOTE — Progress Notes (Signed)
Subjective:    Patient ID: Mitchell Thornton, male    DOB: [birth year], 61 y.o.   MRN: [id number]  DOS:  05/07/2016 Type of visit - description : cpx Interval history: Since the last office visit he is doing well and taking Xarelto without major problems. Right leg swelling decrease. No pain   Review of Systems  From time to time has right hip pain located at the right buttock, usually after he does yard work. No problems on the left side or on the back.  Other than above, a 14 point review of systems is negative     Past Medical History:  Diagnosis Date  . Colitis    Cscope ~ 2007, L sided, Rx lialda  . Glaucoma     Past Surgical History:  Procedure Laterality Date  . CYSTECTOMY     from leg     Social History   Social History  . Marital status: Married    Spouse name: N/A  . Number of children: 3  . 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 Smoking status: Never Smoker  . Smokeless tobacco: Never Used     Comment: NEVER USED TOBACCO  . Alcohol use No  . Drug use: No  . Sexual activity: Not on file   Other Topics Concern  . Not on file   Social History Narrative   Original from [place], moved to the [place] in 1982, moved to [place] ~ 1995   Household- pt, wife, 1 son, 1 daughter                     Family History  Problem Relation Age of Onset  . Diabetes Mother   . Deep vein thrombosis Mother     "clots" mother   . Hypertension Mother   . Prostate cancer Father 71  . Aneurysm Father     brain aneurysm  . Coronary artery disease Neg Hx   . Stroke Neg Hx   . Colon cancer Neg Hx      Allergies as of 05/07/2016   No Known Allergies     Medication List       Accurate as of 05/07/16 11:59 PM. Always use your most recent med list.          amLODipine 5 MG tablet Commonly known as:  NORVASC Take 1 tablet (5 mg total) by mouth daily.   ANUCORT-HC 25 MG suppository Generic  drug:  hydrocortisone Place 1 suppository rectally 2 (two) times daily as needed.   folic acid 1 MG tablet Commonly known as:  FOLVITE TAKE 1 TABLET(1 MG) BY MOUTH DAILY   latanoprost 0.005 % ophthalmic solution Commonly known as:  XALATAN Place 1 drop into both eyes at bedtime.   lidocaine 5 % ointment Commonly known as:  XYLOCAINE Apply 1 application topically as needed.   mesalamine 1.2 g EC tablet Commonly known as:  LIALDA Take 1.2 g by mouth 2 (two) times daily. Take 2 tablet by mouth every morning.   naproxen 500 MG tablet Commonly known as:  NAPROSYN Take 1 tablet by mouth 2 (two) times daily with a meal. Reported on 03/07/2015   rivaroxaban 20 MG Tabs tablet Commonly known as:  XARELTO Take 1 tablet (20 mg total) by mouth daily with supper.   STOOL SOFTENER 100 MG capsule Generic drug:  docusate sodium Take 1 capsule by mouth  every 12 (twelve) hours as needed. Reported on 03/07/2015          Objective:   Physical Exam BP 128/84 (BP Location: Left Arm, Patient Position: Sitting, Cuff Size: Normal)   Pulse (!) 58   Temp 98 F (36.7 C) (Oral)   Resp 14   Ht 6' (1.829 m)   Wt (!) 306 lb 8 oz (139 kg)   SpO2 97%   BMI 41.57 kg/m   General:   Well developed, well nourished . NAD.  Neck: No  thyromegaly  HEENT:  Normocephalic . Face symmetric, atraumatic Lungs:  CTA B Normal respiratory effort, no intercostal retractions, no accessory muscle use. Heart: RRR,  no murmur.  No pretibial edema bilaterally . Calves symmetric, no TTP Abdomen:  Not distended, soft, non-tender. No rebound or rigidity.   Skin: Exposed areas without rash. Not pale. Not jaundice Neurologic:  alert & oriented X3.  Speech normal, gait appropriate for age and unassisted Strength symmetric and appropriate for age.  Psych: Cognition and judgment appear intact.  Cooperative with normal attention span and concentration.  Behavior appropriate. No anxious or depressed appearing.      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 bariatric clinic, declined for now. + Lupus anticoagulant-DVT 02-2016: On Xarelto, explained the patient and he is high risk for future events, refer to hematology for further advice: Lifelong anticoagulation?. He is taking folic acid since the first DVT, will check a homocysteine level. Hip pain: Mild, recommend Tylenol, as needed. Avoid NSAIDs. RTC one year

## 2016-05-07 NOTE — Telephone Encounter (Signed)
LMOM at [facility] GI medical records [phone]) requesting cscope report and path report from 01/2016 w/ Dr. Loman Chroman faxed to [phone].

## 2016-05-08 ENCOUNTER — Other Ambulatory Visit: Payer: Self-pay | Admitting: Hematology & Oncology

## 2016-05-08 DIAGNOSIS — D529 Folate deficiency anemia, unspecified: Secondary | ICD-10-CM

## 2016-05-08 LAB — HOMOCYSTEINE: Homocysteine: 12.1 umol/L — ABNORMAL HIGH (ref <11.4)

## 2016-05-08 LAB — HEPATITIS C ANTIBODY: HCV Ab: NEGATIVE

## 2016-05-08 NOTE — Assessment & Plan Note (Signed)
Hyperglycemia: Checking an A1c HTN: Seems well-controlled with amlodipine Morbid obesity: Diet discussed, ? bariatric clinic, declined for now. + Lupus anticoagulant-DVT 02-2016: On Xarelto, explained the patient and he is high risk for future events, refer to hematology for further advice: Lifelong anticoagulation?. He is taking folic acid since the first DVT, will check a homocysteine level. Hip pain: Mild, recommend Tylenol, as needed. Avoid NSAIDs. RTC one year

## 2016-05-08 NOTE — Telephone Encounter (Signed)
Received GI cscope report. Forwarded to PCP for review.

## 2016-05-10 ENCOUNTER — Other Ambulatory Visit: Payer: Self-pay | Admitting: Internal Medicine

## 2016-05-11 NOTE — Telephone Encounter (Signed)
Had a colonoscopy 01/21/2016, per review of "care everywhere" in 2 years.

## 2016-05-28 ENCOUNTER — Ambulatory Visit: Payer: BLUE CROSS/BLUE SHIELD

## 2016-05-28 ENCOUNTER — Ambulatory Visit (HOSPITAL_BASED_OUTPATIENT_CLINIC_OR_DEPARTMENT_OTHER): Payer: BLUE CROSS/BLUE SHIELD | Admitting: Family

## 2016-05-28 ENCOUNTER — Other Ambulatory Visit (HOSPITAL_BASED_OUTPATIENT_CLINIC_OR_DEPARTMENT_OTHER): Payer: BLUE CROSS/BLUE SHIELD

## 2016-05-28 VITALS — BP 146/60 | HR 70 | Temp 98.2°F | Resp 16 | Wt 307.8 lb

## 2016-05-28 DIAGNOSIS — I82409 Acute embolism and thrombosis of unspecified deep veins of unspecified lower extremity: Secondary | ICD-10-CM

## 2016-05-28 DIAGNOSIS — Z7901 Long term (current) use of anticoagulants: Secondary | ICD-10-CM | POA: Diagnosis not present

## 2016-05-28 LAB — CBC WITH DIFFERENTIAL (CANCER CENTER ONLY)
BASO#: 0 10*3/uL (ref 0.0–0.2)
BASO%: 0.5 % (ref 0.0–2.0)
EOS ABS: 0.2 10*3/uL (ref 0.0–0.5)
EOS%: 2.6 % (ref 0.0–7.0)
HCT: 44 % (ref 38.7–49.9)
HGB: 14.5 g/dL (ref 13.0–17.1)
LYMPH#: 3.7 10*3/uL — ABNORMAL HIGH (ref 0.9–3.3)
LYMPH%: 48.2 % — ABNORMAL HIGH (ref 14.0–48.0)
MCH: 26.6 pg — AB (ref 28.0–33.4)
MCHC: 33 g/dL (ref 32.0–35.9)
MCV: 81 fL — ABNORMAL LOW (ref 82–98)
MONO#: 0.7 10*3/uL (ref 0.1–0.9)
MONO%: 8.7 % (ref 0.0–13.0)
NEUT#: 3.1 10*3/uL (ref 1.5–6.5)
NEUT%: 40 % (ref 40.0–80.0)
PLATELETS: 188 10*3/uL (ref 145–400)
RBC: 5.46 10*6/uL (ref 4.20–5.70)
RDW: 14.4 % (ref 11.1–15.7)
WBC: 7.7 10*3/uL (ref 4.0–10.0)

## 2016-05-28 LAB — CMP (CANCER CENTER ONLY)
ALT(SGPT): 30 U/L (ref 10–47)
AST: 29 U/L (ref 11–38)
Albumin: 3.8 g/dL (ref 3.3–5.5)
Alkaline Phosphatase: 86 U/L — ABNORMAL HIGH (ref 26–84)
BUN: 9 mg/dL (ref 7–22)
CHLORIDE: 100 meq/L (ref 98–108)
CO2: 29 meq/L (ref 18–33)
CREATININE: 1.1 mg/dL (ref 0.6–1.2)
Calcium: 9.3 mg/dL (ref 8.0–10.3)
GLUCOSE: 103 mg/dL (ref 73–118)
Potassium: 3.9 mEq/L (ref 3.3–4.7)
SODIUM: 137 meq/L (ref 128–145)
Total Bilirubin: 0.7 mg/dl (ref 0.20–1.60)
Total Protein: 7.4 g/dL (ref 6.4–8.1)

## 2016-05-28 NOTE — Progress Notes (Signed)
Hematology/Oncology Consultation   Name: JARRAD MCLEES      MRN: [id number]    Location: Room/bed info not found  Date: 05/28/2016 Time:4:44 PM   REFERRING PHYSICIAN: Willow Ora, MD  REASON FOR CONSULT:  DVT of the right lower extremity   DIAGNOSIS:  DVT of the right lower extremity History of DVT of the left lower extremity - tibioperoneal trunk Positive lupus anticoagulant/negative hexose phosphate test  HISTORY OF PRESENT ILLNESS: Ms. Gillooly is a very pleasant 61 yo African American gentleman with a DVT of the right lower extremity in the calf (posterior tibial vein). We have seen him in the past for a DVT of the left lower extremity. He was on Xarelto for 6 months (completing in October 2016) and then transitioned to Aspirin 325 mg PO daily. 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  We last saw him in May 2017. He verbalized that he was taking his aspirin daily as prescribed and staying well hydrated.  He drove to [place] to see family in November but states that he tried to stop and get out and move. He had done no other travelling. He states that he developed pain in his right heel while walking on the treadmill in January of this year and [place] confirmed DVT.  He is currently on Xarelto 20 mg PO daily and has had no episodes of bleeding, bruising or petechiae.  He has a history of being positive for the lupus anticoagulant.  He is not a smoker and does not drink alcoholic beverages. No lymphadenopathy found on exam.  Family history of thrombus includes his other whom he said possibly dies from a blood clot. His brother also has history of DVT and treatment with anticoagulation.  No fever, chills, nv/ ,cough, rash, dizziness, SOB, chest pain, palpitations abdominal pain or changes in bowel or bladder habits.  No swelling or c/o post phlebitic pain in his extremities. Pedal pulses are +2. No numbness or tingling in his extremities.  He has  maintained a good appetite and is staying well hydrated. His weight is stable.   ROS: All other 10 point review of systems is negative.   PAST MEDICAL HISTORY:   Past Medical History:  Diagnosis Date  . Colitis    Cscope ~ 2007, L sided, Rx lialda  . Glaucoma     ALLERGIES: No Known Allergies    MEDICATIONS:  Current Outpatient Prescriptions on File Prior to Visit  Medication Sig Dispense Refill  . amLODipine (NORVASC) 5 MG tablet Take 1 tablet (5 mg total) by mouth daily. 90 tablet 0  . ANUCORT-HC 25 MG suppository Place 1 suppository rectally 2 (two) times daily as needed.  0  . folic acid (FOLVITE) 1 MG tablet TAKE 1 TABLET(1 MG) BY MOUTH DAILY 30 tablet 0  . latanoprost (XALATAN) 0.005 % ophthalmic solution Place 1 drop into both eyes at bedtime.      . lidocaine (XYLOCAINE) 5 % ointment Apply 1 application topically as needed. 35.44 g 0  . mesalamine (LIALDA) 1.2 G EC tablet Take 1.2 g by mouth 2 (two) times daily. Take 2 tablet by mouth every morning.    . rivaroxaban (XARELTO) 20 MG TABS tablet Take 1 tablet (20 mg total) by mouth daily with supper. 30 tablet 2  . STOOL SOFTENER 100 MG capsule Take 1 capsule by mouth every 12 (twelve) hours as needed. Reported on 03/07/2015  0   No current facility-administered  medications on file prior to visit.      PAST SURGICAL HISTORY Past Surgical History:  Procedure Laterality Date  . CYSTECTOMY     from leg     FAMILY HISTORY: Family History  Problem Relation Age of Onset  . Diabetes Mother   . Deep vein thrombosis Mother     "clots" mother   . Hypertension Mother   . Prostate cancer Father 4  . Aneurysm Father     brain aneurysm  . Coronary artery disease Neg Hx   . Stroke Neg Hx   . Colon cancer Neg Hx     SOCIAL HISTORY:  reports that he has never smoked. He has never used smokeless tobacco. He reports that he does not drink alcohol or use drugs.  PERFORMANCE STATUS: The patient's performance status is 1 -  Symptomatic but completely ambulatory  PHYSICAL EXAM: Most Recent Vital Signs: Blood pressure (!) 146/60, pulse 70, temperature 98.2 F (36.8 C), temperature source Oral, resp. rate 16, weight (!) 307 lb 12.8 oz (139.6 kg), SpO2 100 %. BP (!) 146/60 (BP Location: Left Arm, Patient Position: Sitting)   Pulse 70   Temp 98.2 F (36.8 C) (Oral)   Resp 16   Wt (!) 307 lb 12.8 oz (139.6 kg)   SpO2 100%   BMI 41.75 kg/m   General Appearance:    Alert, cooperative, no distress, appears stated age  Head:    Normocephalic, without obvious abnormality, atraumatic  Eyes:    PERRL, conjunctiva/corneas clear, EOM's intact, fundi    benign, both eyes             Throat:   Lips, mucosa, and tongue normal; teeth and gums normal  Neck:   Supple, symmetrical, trachea midline, no adenopathy;       thyroid:  No enlargement/tenderness/nodules; no carotid   bruit or JVD  Back:     Symmetric, no curvature, ROM normal, no CVA tenderness  Lungs:     Clear to auscultation bilaterally, respirations unlabored  Chest wall:    No tenderness or deformity  Heart:    Regular rate and rhythm, S1 and S2 normal, no murmur, rub   or gallop  Abdomen:     Soft, non-tender, bowel sounds active all four quadrants,    no masses, no organomegaly        Extremities:   Extremities normal, atraumatic, no cyanosis or edema  Pulses:   2+ and symmetric all extremities  Skin:   Skin color, texture, turgor normal, no rashes or lesions  Lymph nodes:   Cervical, supraclavicular, and axillary nodes normal  Neurologic:   CNII-XII intact. Normal strength, sensation and reflexes      throughout    LABORATORY DATA:  Results for orders placed or performed in visit on 05/28/16 (from the past 48 hour(s))  CBC with Differential [facility])     Status: Abnormal   Collection Time: 05/28/16  3:20 PM  Result Value Ref Range   WBC 7.7 4.0 - 10.0 10e3/uL   RBC 5.46 4.20 - 5.70 10e6/uL   HGB 14.5 13.0 - 17.1 g/dL   HCT 19.1 47.8 -  29.5 %   MCV 81 (L) 82 - 98 fL   MCH 26.6 (L) 28.0 - 33.4 pg   MCHC 33.0 32.0 - 35.9 g/dL   RDW 62.1 30.8 - 65.7 %   Platelets 188 145 - 400 10e3/uL   NEUT# 3.1 1.5 - 6.5 10e3/uL   LYMPH# 3.7 (H) 0.9 -  3.3 10e3/uL   MONO# 0.7 0.1 - 0.9 10e3/uL   Eosinophils Absolute 0.2 0.0 - 0.5 10e3/uL   BASO# 0.0 0.0 - 0.2 10e3/uL   NEUT% 40.0 40.0 - 80.0 %   LYMPH% 48.2 (H) 14.0 - 48.0 %   MONO% 8.7 0.0 - 13.0 %   EOS% 2.6 0.0 - 7.0 %   BASO% 0.5 0.0 - 2.0 %  CMP STAT ([facility] only)     Status: Abnormal   Collection Time: 05/28/16  3:20 PM  Result Value Ref Range   Sodium 137 128 - 145 mEq/L   Potassium 3.9 3.3 - 4.7 mEq/L   Chloride 100 98 - 108 mEq/L   CO2 29 18 - 33 mEq/L   Glucose, Bld 103 73 - 118 mg/dL   BUN, Bld 9 7 - 22 mg/dL   Creat 1.1 0.6 - 1.2 mg/dl   Total Bilirubin 1.61 0.20 - 1.60 mg/dl   Alkaline Phosphatase 86 (H) 26 - 84 U/L   AST 29 11 - 38 U/L   ALT(SGPT) 30 10 - 47 U/L   Total Protein 7.4 6.4 - 8.1 g/dL   Albumin 3.8 3.3 - 5.5 g/dL   Calcium 9.3 8.0 - 09.6 mg/dL      RADIOGRAPHY: No results found.     PATHOLOGY: None  ASSESSMENT/PLAN: Mr. Joylene John is a very pleasant 61 yo African American gentleman with history of DVT of the left lower extremity and treatment with 6 months of Xarelto (finishing in October 2016) and then was transitioned to aspirin. He was positive for the lupus anticoagulant at that time. His mother and brother also have history of thrombus.  He is back again now with a DVT of the right lower extremity and is back on Xarelto 20 mg PO daily. He is doing ok at this time and is asymptomatic.   We will repeat an [place] of both lower extremities to assess his response to the Xarelto next week.  We will then plan to see him back in 3 months for repeat lab work and follow-up.   All questions were answered and he is in agreement with the plan. He will contact our office with any other questions or concerns. We can certainly see him sooner if  necessary.  He was discussed with and also seen by Dr. Myna [place] and he is in agreement with the aforementioned.   [facility]     Addendum: I saw and examined the patient with Daaiel Starlin. I agree with the above assessment.  He will be on Xarelto. He will continue to be on Xarelto long-term. We may consider him for low-dose Xarelto after full dose Xarelto.  He has had a history of a lupus anticoagulant in the past. We will recheck this. Back in May 2017, there was no obvious lupus anticoagulant.  We will plan to see him back in 3 months. We will see what his Dopplers show. We will order these next week.  We spent about 35 minutes with him. It was nice to see him again.  Christin Bach, MD

## 2016-05-29 LAB — D-DIMER, QUANTITATIVE: D-DIMER: 0.31 mg/L FEU (ref 0.00–0.49)

## 2016-06-03 DIAGNOSIS — Z566 Other physical and mental strain related to work: Secondary | ICD-10-CM | POA: Diagnosis not present

## 2016-06-03 DIAGNOSIS — Z733 Stress, not elsewhere classified: Secondary | ICD-10-CM | POA: Diagnosis not present

## 2016-06-07 ENCOUNTER — Ambulatory Visit (HOSPITAL_BASED_OUTPATIENT_CLINIC_OR_DEPARTMENT_OTHER)
Admission: RE | Admit: 2016-06-07 | Discharge: 2016-06-07 | Disposition: A | Discharge status: Home / Self Care | Payer: BLUE CROSS/BLUE SHIELD | Source: Ambulatory Visit | Attending: Family | Admitting: Family

## 2016-06-07 ENCOUNTER — Other Ambulatory Visit: Payer: Self-pay | Admitting: Hematology & Oncology

## 2016-06-07 DIAGNOSIS — I82409 Acute embolism and thrombosis of unspecified deep veins of unspecified lower extremity: Secondary | ICD-10-CM | POA: Insufficient documentation

## 2016-06-07 DIAGNOSIS — D529 Folate deficiency anemia, unspecified: Secondary | ICD-10-CM

## 2016-06-07 DIAGNOSIS — I82403 Acute embolism and thrombosis of unspecified deep veins of lower extremity, bilateral: Secondary | ICD-10-CM | POA: Diagnosis not present

## 2016-06-07 DIAGNOSIS — I82442 Acute embolism and thrombosis of left tibial vein: Secondary | ICD-10-CM | POA: Insufficient documentation

## 2016-06-07 DIAGNOSIS — I82441 Acute embolism and thrombosis of right tibial vein: Secondary | ICD-10-CM | POA: Insufficient documentation

## 2016-06-08 ENCOUNTER — Telehealth: Payer: Self-pay | Admitting: *Deleted

## 2016-06-08 NOTE — Telephone Encounter (Signed)
-----   Message from Verdie MosherSarah M [place], NP sent at 06/08/2016  9:46 AM EDT ----- Regarding: US Showed stable chronic thrombus. Will continue same treatment regimen with Xarelto. Thank you!  Sarah  ----- Message ----- From: Leory PlowmanInterface, Rad Results In Sent: 06/07/2016   3:55 PM To: Verdie MosherSarah M [place], NP

## 2016-06-30 ENCOUNTER — Other Ambulatory Visit: Payer: Self-pay

## 2016-06-30 MED ORDER — RIVAROXABAN 20 MG PO TABS: 20.0000 mg | ORAL_TABLET | Freq: Every day | ORAL | 3 refills | Status: DC

## 2016-07-07 ENCOUNTER — Other Ambulatory Visit: Payer: Self-pay | Admitting: Hematology & Oncology

## 2016-07-07 ENCOUNTER — Other Ambulatory Visit: Payer: Self-pay | Admitting: Internal Medicine

## 2016-07-07 DIAGNOSIS — D529 Folate deficiency anemia, unspecified: Secondary | ICD-10-CM

## 2016-09-11 ENCOUNTER — Other Ambulatory Visit (HOSPITAL_BASED_OUTPATIENT_CLINIC_OR_DEPARTMENT_OTHER): Payer: BLUE CROSS/BLUE SHIELD

## 2016-09-11 ENCOUNTER — Ambulatory Visit (HOSPITAL_BASED_OUTPATIENT_CLINIC_OR_DEPARTMENT_OTHER): Payer: BLUE CROSS/BLUE SHIELD | Admitting: Family

## 2016-09-11 VITALS — BP 134/60 | HR 48 | Temp 98.7°F | Resp 19 | Wt 295.0 lb

## 2016-09-11 DIAGNOSIS — D6862 Lupus anticoagulant syndrome: Secondary | ICD-10-CM | POA: Diagnosis not present

## 2016-09-11 DIAGNOSIS — I82543 Chronic embolism and thrombosis of tibial vein, bilateral: Secondary | ICD-10-CM

## 2016-09-11 DIAGNOSIS — Z7901 Long term (current) use of anticoagulants: Secondary | ICD-10-CM | POA: Diagnosis not present

## 2016-09-11 DIAGNOSIS — I82409 Acute embolism and thrombosis of unspecified deep veins of unspecified lower extremity: Secondary | ICD-10-CM | POA: Diagnosis not present

## 2016-09-11 LAB — CBC WITH DIFFERENTIAL (CANCER CENTER ONLY)
BASO#: 0 10*3/uL (ref 0.0–0.2)
BASO%: 0.6 % (ref 0.0–2.0)
EOS%: 2.4 % (ref 0.0–7.0)
Eosinophils Absolute: 0.1 10*3/uL (ref 0.0–0.5)
HEMATOCRIT: 43.2 % (ref 38.7–49.9)
HGB: 14.5 g/dL (ref 13.0–17.1)
LYMPH#: 2.4 10*3/uL (ref 0.9–3.3)
LYMPH%: 44.6 % (ref 14.0–48.0)
MCH: 26.5 pg — ABNORMAL LOW (ref 28.0–33.4)
MCHC: 33.6 g/dL (ref 32.0–35.9)
MCV: 79 fL — ABNORMAL LOW (ref 82–98)
MONO#: 0.6 10*3/uL (ref 0.1–0.9)
MONO%: 10.9 % (ref 0.0–13.0)
NEUT%: 41.5 % (ref 40.0–80.0)
NEUTROS ABS: 2.3 10*3/uL (ref 1.5–6.5)
Platelets: 137 10*3/uL — ABNORMAL LOW (ref 145–400)
RBC: 5.48 10*6/uL (ref 4.20–5.70)
RDW: 14.9 % (ref 11.1–15.7)
WBC: 5.4 10*3/uL (ref 4.0–10.0)

## 2016-09-11 LAB — COMPREHENSIVE METABOLIC PANEL
ALBUMIN: 3.8 g/dL (ref 3.5–5.0)
ALK PHOS: 91 U/L (ref 40–150)
ALT: 18 U/L (ref 0–55)
AST: 21 U/L (ref 5–34)
Anion Gap: 7 mEq/L (ref 3–11)
BUN: 12.5 mg/dL (ref 7.0–26.0)
CALCIUM: 9.5 mg/dL (ref 8.4–10.4)
CO2: 25 mEq/L (ref 22–29)
Chloride: 105 mEq/L (ref 98–109)
Creatinine: 0.9 mg/dL (ref 0.7–1.3)
EGFR: >90 mL/min/{1.73_m2} (ref >90)
GLUCOSE: 90 mg/dL (ref 70–140)
Potassium: 4 mEq/L (ref 3.5–5.1)
SODIUM: 137 meq/L (ref 136–145)
Total Bilirubin: 0.46 mg/dL (ref 0.20–1.20)
Total Protein: 7.7 g/dL (ref 6.4–8.3)

## 2016-09-11 NOTE — Progress Notes (Signed)
Hematology and Oncology Follow Up Visit  Mitchell Thornton [id number] 1955/07/31 61 y.o. 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 is here today for follow-up. He is doing well on Xarelto and verbalized that he is taking 20 mg PO daily with dinner.  He has had no episodes of bleeding, bruising or petechiae. No lymphadenopathy found on exam.  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 No fever, chills, n/v, cough, rash, dizziness, SOB, chest pain, palpitations, abdominal pain or changes in bowel or bladder habits.  He has had some tightness in his right ankle and great toe that comes and goes. He states that stretching feels good. No c/o pain at this time. No swelling, tenderness, numbness or tingling in his extremities at this time.  He has maintained a good appetite and is making sure to stay well hydrated. His weight is stable.   ECOG Performance Status: 1 - Symptomatic but completely ambulatory  Medications:  Allergies as of 09/11/2016   No Known Allergies     Medication List       Accurate as of 09/11/16 11:24 AM. Always use your most recent med list.          amLODipine 5 MG tablet Commonly known as:  NORVASC Take 1 tablet (5 mg total) by mouth daily.   ANUCORT-HC 25 MG suppository Generic drug:  hydrocortisone Place 1 suppository rectally 2 (two) times daily as needed.   folic acid 1 MG tablet Commonly known as:  FOLVITE TAKE 1 TABLET(1 MG) BY MOUTH DAILY   latanoprost 0.005 % ophthalmic solution Commonly known as:  XALATAN Place 1 drop into both eyes at bedtime.   lidocaine 5 %  ointment Commonly known as:  XYLOCAINE Apply 1 application topically as needed.   mesalamine 1.2 g EC tablet Commonly known as:  LIALDA Take 1.2 g by mouth 2 (two) times daily. Take 2 tablet by mouth every morning.   rivaroxaban 20 MG Tabs tablet Commonly known as:  XARELTO Take 1 tablet (20 mg total) by mouth daily with supper.   STOOL SOFTENER 100 MG capsule Generic drug:  docusate sodium Take 1 capsule by mouth every 12 (twelve) hours as needed. Reported on 03/07/2015       Allergies: No Known Allergies  Past Medical History, Surgical history, Social history, and Family History were reviewed and updated.  Review of Systems: All other 10 point review of systems is negative.   Physical Exam:  vitals were not taken for this visit.  Wt Readings from Last 3 Encounters:  05/28/16 (!) 307 lb 12.8 oz (139.6 kg)  05/07/16 (!) 306 lb 8 oz (139 kg)  03/03/16 (!) 307 lb (139.3 kg)    Ocular: Sclerae unicteric, pupils equal, round and reactive to light Ear-nose-throat: Oropharynx clear, dentition fair Lymphatic: No cervical, supraclavicular or axillary adenopathy Lungs no rales or rhonchi, good excursion bilaterally Heart regular rate and rhythm, no murmur appreciated Abd soft, nontender, positive bowel sounds, no liver or spleen tip palpated on exam, no fluid wave  MSK no focal spinal tenderness, no joint edema Neuro: non-focal, well-oriented, appropriate affect Breasts: Deferred  Lab Results  Component Value Date   WBC 5.4 09/11/2016   HGB 14.5 09/11/2016   HCT 43.2 09/11/2016   MCV 79 (L) 09/11/2016   PLT 137 (L) 09/11/2016   No results found for: FERRITIN, IRON, TIBC, UIBC, IRONPCTSAT Lab Results  Component Value Date   RBC 5.48 09/11/2016   No results found for: KPAFRELGTCHN, LAMBDASER, KAPLAMBRATIO No results found for: IGGSERUM, IGA, IGMSERUM No results found for: Dorene ArOTALPROTELP, ALBUMINELP, A1GS, A2GS, Karn PicklerBETS, BETA2SER, GAMS, MSPIKE, SPEI   Chemistry       Component Value Date/Time   NA 137 05/28/2016 1520   K 3.9 05/28/2016 1520   CL 100 05/28/2016 1520   CO2 29 05/28/2016 1520   BUN 9 05/28/2016 1520   CREATININE 1.1 05/28/2016 1520      Component Value Date/Time   CALCIUM 9.3 05/28/2016 1520   ALKPHOS 86 (H) 05/28/2016 1520   AST 29 05/28/2016 1520   ALT 30 05/28/2016 1520   BILITOT 0.70 05/28/2016 1520      Impression and Plan: Ms. 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 He is now on lifelong anticoagulation with Xarelto 20 mg PO daily and verbalized that he is taking this as prescribed.   We will continue to follow along with him and plan to see him back again in another 4 months for repeat lab work and follow-up. He is in agreement with the plan and will contact our office with any questions or concerns or go to the ED in the event of an emergency. We can certainly see him sooner if need be.   Verdie MosherINCINNATI,SARAH M, NP 8/10/201811:24 AM

## 2016-09-12 LAB — D-DIMER, QUANTITATIVE: D-DIMER: 0.24 mg/L FEU (ref 0.00–0.49)

## 2016-09-14 LAB — LUPUS ANTICOAGULANT PANEL
DRVVT: 62 s — AB (ref 0.0–47.0)
PTT-LA: 36.9 s (ref 0.0–51.9)
dRVVT Confirm: 1.4 ratio — ABNORMAL HIGH (ref 0.8–1.2)
dRVVT Mix: 48.6 s — ABNORMAL HIGH (ref 0.0–47.0)

## 2016-09-28 ENCOUNTER — Other Ambulatory Visit: Payer: Self-pay | Admitting: Internal Medicine

## 2016-09-28 ENCOUNTER — Other Ambulatory Visit: Payer: Self-pay | Admitting: *Deleted

## 2016-09-28 MED ORDER — RIVAROXABAN 20 MG PO TABS: 20.0000 mg | ORAL_TABLET | Freq: Every day | ORAL | 2 refills | Status: DC

## 2016-11-22 IMAGING — US US EXTREM LOW VENOUS*L*
1 series · 13 of 24 positions shown · non-contrast
Comparison: None.

CLINICAL DATA: Subacute onset of left calf and foot swelling for 2
weeks. Aching at the top of the left foot. Initial encounter.



[Series 1: us extrem low venous*left* · 0.08mm/px · 13 of 45 slices shown]
[im 1/45]
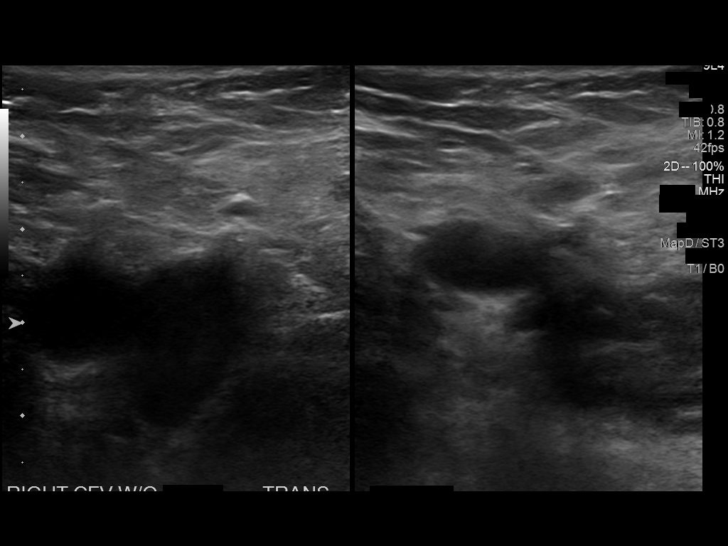
[im 4/45]
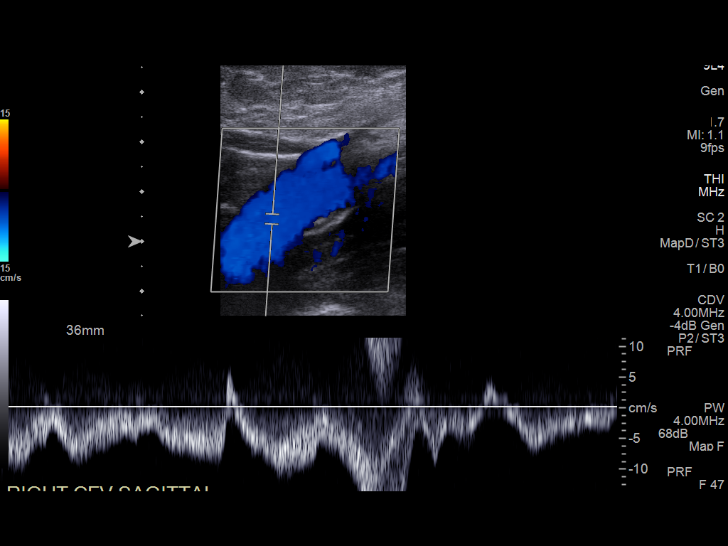
[im 8/45]
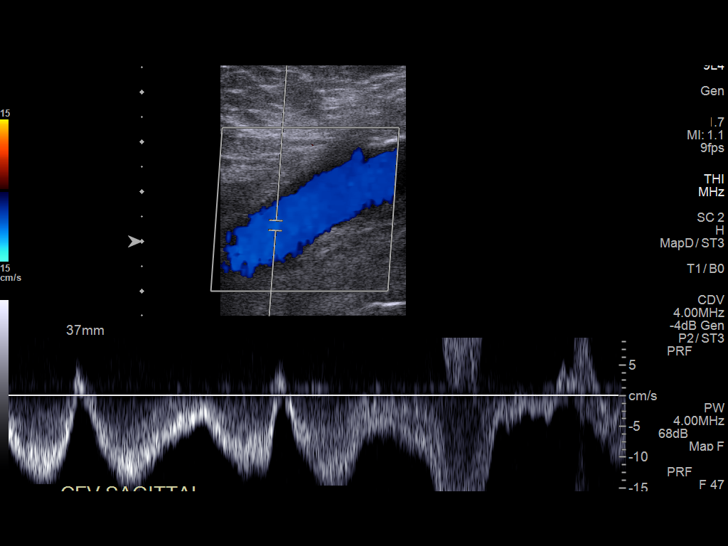
[im 12/45]
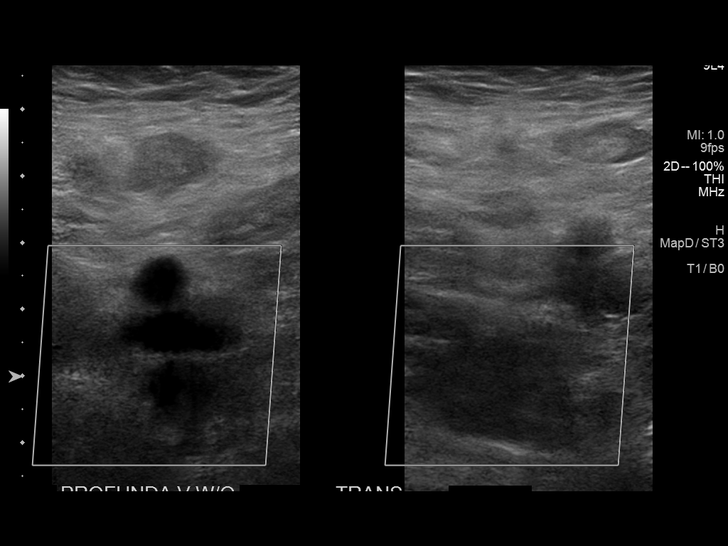
[im 16/45]
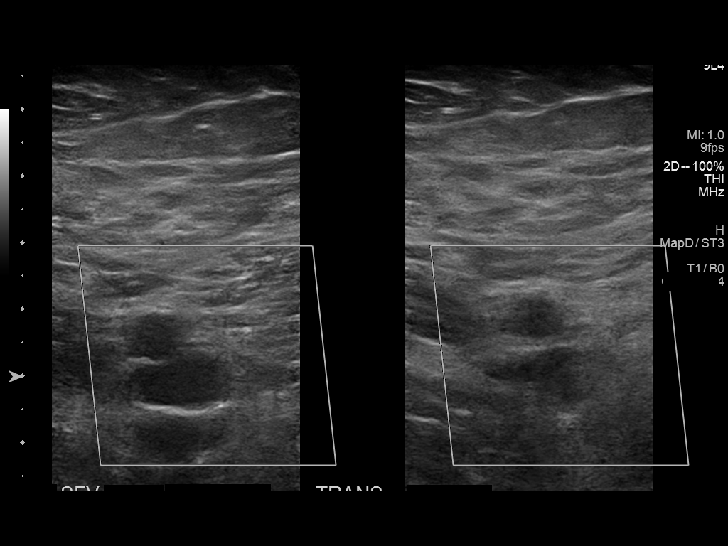
[im 20/45]
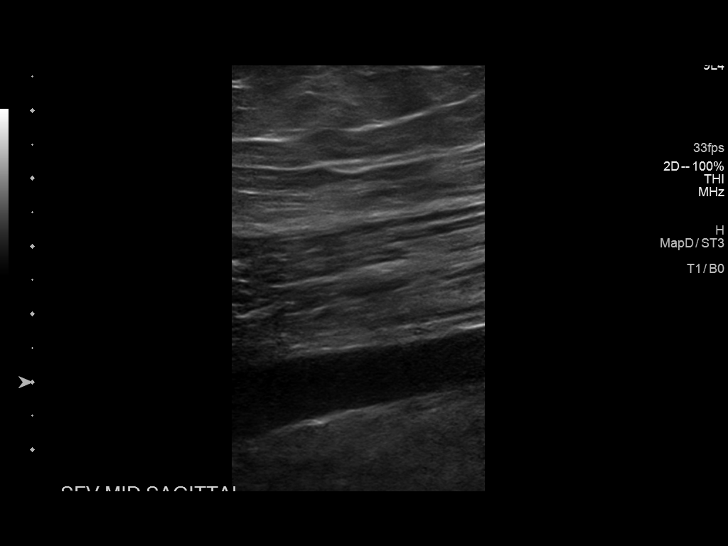
[im 23/45]
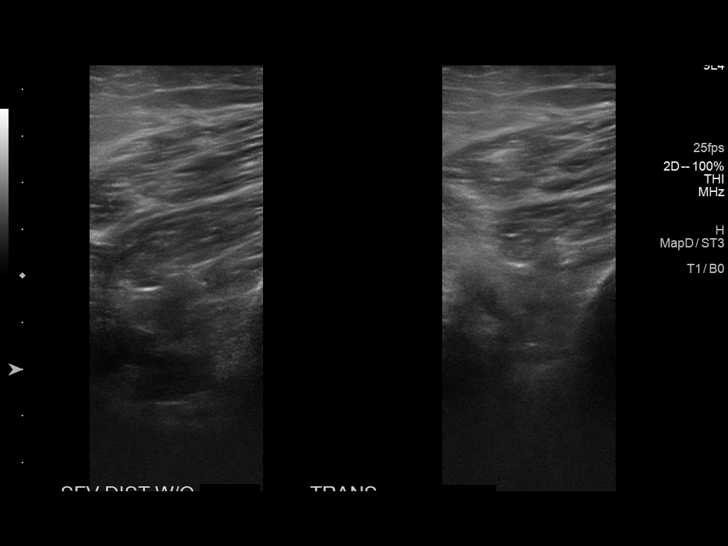
[im 25/45]
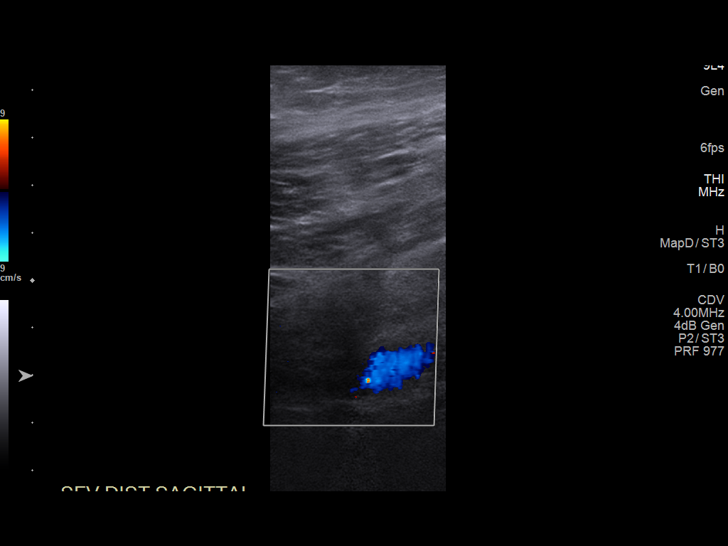
[im 29/45]
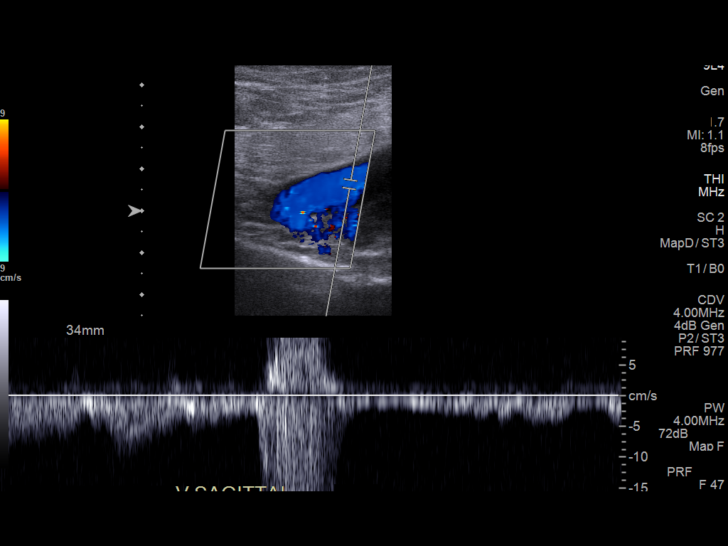
[im 33/45]
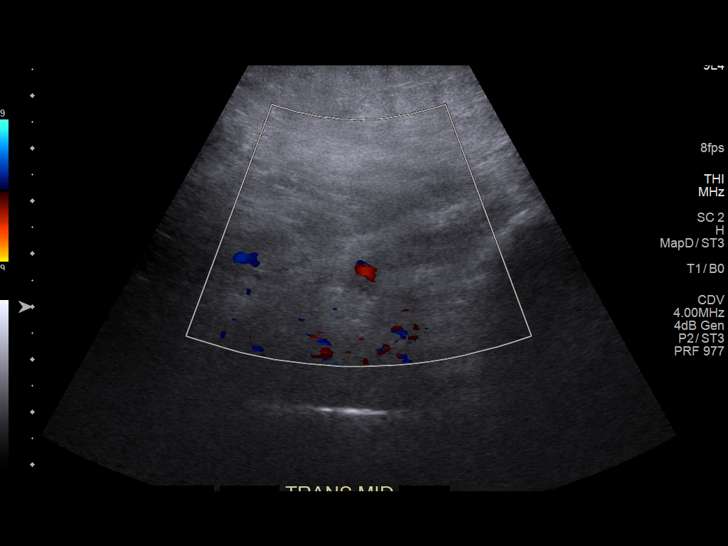
[im 37/45]
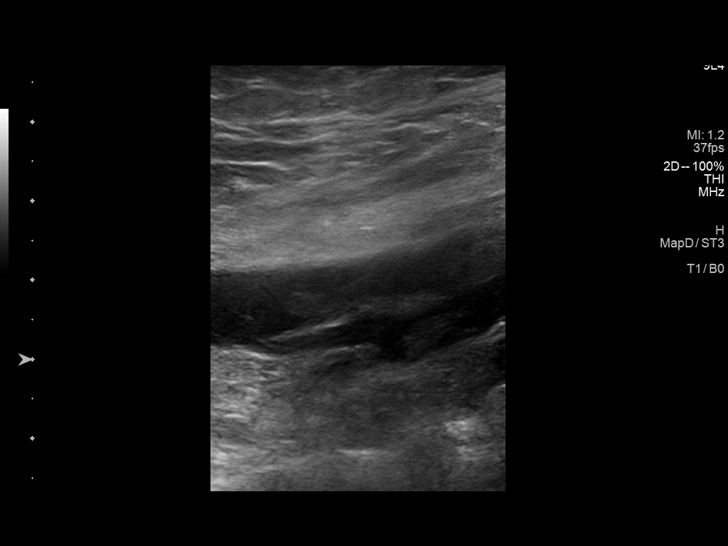
[im 41/45]
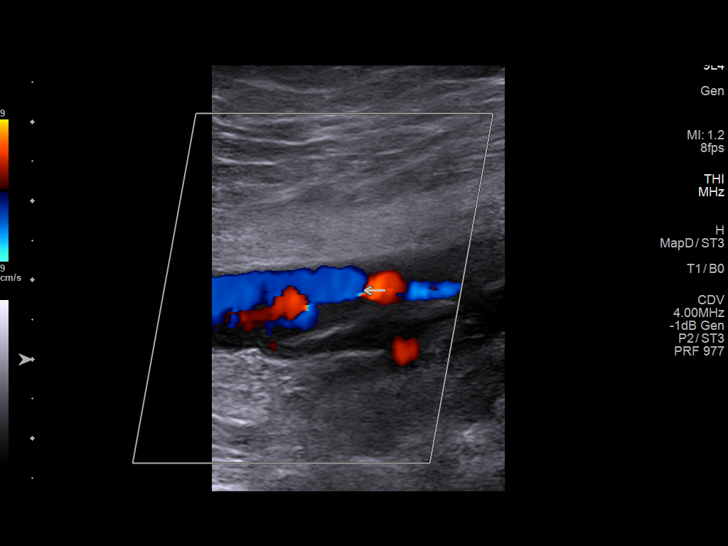
[im 45/45]
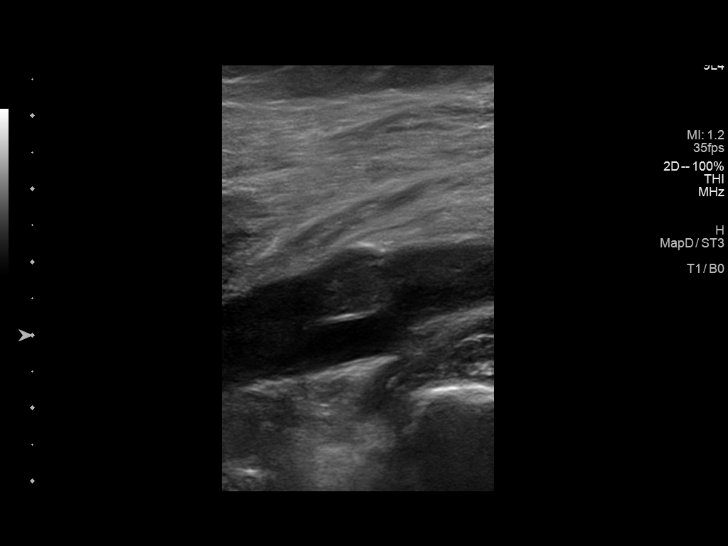

[13 of 24 positions shown; findings below may reference images not displayed]

FINDINGS: Contralateral Common Femoral Vein: Respiratory phasicity is normal
and symmetric with the symptomatic side. No evidence of thrombus.
Normal compressibility.

Common Femoral Vein: No evidence of thrombus. Normal
compressibility, respiratory phasicity and response to augmentation.

Saphenofemoral Junction: No evidence of thrombus. Normal
compressibility and flow on color Doppler imaging.

Profunda Femoral Vein: No evidence of thrombus. Normal
compressibility and flow on color Doppler imaging.

Femoral Vein: No evidence of thrombus. Normal compressibility,
respiratory phasicity and response to augmentation.

Popliteal Vein: No evidence of thrombus. Normal compressibility,
respiratory phasicity and response to augmentation.

Calf Veins: Occlusive venous thrombus is noted within the left
tibioperoneal trunk, just below the popliteal fossa. The more
inferior calf veins are not characterized on this study.

Superficial Great Saphenous Vein: No evidence of thrombus. Normal
compressibility and flow on color Doppler imaging.

Venous Reflux:  None.

Other Findings:  None.
IMPRESSION: Occlusive deep venous thrombosis noted within the left tibioperoneal
trunk, just below the popliteal fossa. The more inferior calf veins
are not characterized on this study. No additional deep venous
thrombosis seen.

These results were called by telephone at the time of interpretation
on 05/28/2014 at [DATE] to Dr. TAKELE ROCK, who verbally
acknowledged these results.

## 2017-01-11 ENCOUNTER — Other Ambulatory Visit: Payer: BLUE CROSS/BLUE SHIELD

## 2017-01-11 ENCOUNTER — Ambulatory Visit: Payer: BLUE CROSS/BLUE SHIELD | Admitting: Family

## 2017-01-28 ENCOUNTER — Ambulatory Visit (HOSPITAL_BASED_OUTPATIENT_CLINIC_OR_DEPARTMENT_OTHER): Payer: BLUE CROSS/BLUE SHIELD | Admitting: Family

## 2017-01-28 ENCOUNTER — Other Ambulatory Visit (HOSPITAL_BASED_OUTPATIENT_CLINIC_OR_DEPARTMENT_OTHER): Payer: BLUE CROSS/BLUE SHIELD

## 2017-01-28 ENCOUNTER — Other Ambulatory Visit: Payer: Self-pay

## 2017-01-28 ENCOUNTER — Encounter: Payer: Self-pay | Admitting: Family

## 2017-01-28 VITALS — BP 144/72 | HR 60 | Temp 98.3°F | Resp 16 | Wt 307.0 lb

## 2017-01-28 DIAGNOSIS — Z86718 Personal history of other venous thrombosis and embolism: Secondary | ICD-10-CM

## 2017-01-28 DIAGNOSIS — I82543 Chronic embolism and thrombosis of tibial vein, bilateral: Secondary | ICD-10-CM

## 2017-01-28 DIAGNOSIS — Z7901 Long term (current) use of anticoagulants: Secondary | ICD-10-CM | POA: Diagnosis not present

## 2017-01-28 DIAGNOSIS — I82409 Acute embolism and thrombosis of unspecified deep veins of unspecified lower extremity: Secondary | ICD-10-CM

## 2017-01-28 LAB — CMP (CANCER CENTER ONLY)
ALBUMIN: 3.4 g/dL (ref 3.3–5.5)
ALT(SGPT): 31 U/L (ref 10–47)
AST: 22 U/L (ref 11–38)
Alkaline Phosphatase: 112 U/L — ABNORMAL HIGH (ref 26–84)
BUN, Bld: 8 mg/dL (ref 7–22)
CHLORIDE: 103 meq/L (ref 98–108)
CO2: 29 meq/L (ref 18–33)
Calcium: 8.9 mg/dL (ref 8.0–10.3)
Creat: 0.8 mg/dl (ref 0.6–1.2)
Glucose, Bld: 87 mg/dL (ref 73–118)
POTASSIUM: 3.9 meq/L (ref 3.3–4.7)
Sodium: 142 mEq/L (ref 128–145)
TOTAL PROTEIN: 7.3 g/dL (ref 6.4–8.1)
Total Bilirubin: 0.6 mg/dl (ref 0.20–1.60)

## 2017-01-28 LAB — CBC WITH DIFFERENTIAL (CANCER CENTER ONLY)
BASO#: 0 10*3/uL (ref 0.0–0.2)
BASO%: 0.3 % (ref 0.0–2.0)
EOS%: 2 % (ref 0.0–7.0)
Eosinophils Absolute: 0.1 10*3/uL (ref 0.0–0.5)
HCT: 43.6 % (ref 38.7–49.9)
HGB: 14.3 g/dL (ref 13.0–17.1)
LYMPH#: 2.6 10*3/uL (ref 0.9–3.3)
LYMPH%: 43.8 % (ref 14.0–48.0)
MCH: 26.3 pg — AB (ref 28.0–33.4)
MCHC: 32.8 g/dL (ref 32.0–35.9)
MCV: 80 fL — ABNORMAL LOW (ref 82–98)
MONO#: 0.7 10*3/uL (ref 0.1–0.9)
MONO%: 11.5 % (ref 0.0–13.0)
NEUT#: 2.5 10*3/uL (ref 1.5–6.5)
NEUT%: 42.4 % (ref 40.0–80.0)
PLATELETS: 165 10*3/uL (ref 145–400)
RBC: 5.43 10*6/uL (ref 4.20–5.70)
RDW: 14.7 % (ref 11.1–15.7)
WBC: 6 10*3/uL (ref 4.0–10.0)

## 2017-01-28 NOTE — Progress Notes (Signed)
Hematology and Oncology Follow Up Visit  Mitchell Thornton [id number] 06/18/1955 61 y.o. 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 is here today for follow-up. He is doing well and has no complaints at this time.  He is staying active and has started working out at a gym with his son.  He has a good appetite and is staying well hydrated. His weight is stable.  He is doing well on Xarelto 10 mg PO daily and is taking as prescribed.  He has had no bleeding, no bruising or petechiae. No lymphadenopathy found on exam.  No fever, chills, n/v, cough, rash, dizziness, SOB, chest pain, palpitations, abdominal pain or changes in bowel or bladder habits.  No swelling, tenderness, numbness or tingling in his extremities. No c/o pain.   ECOG Performance Status: 1 - Symptomatic but completely ambulatory  Medications:  Allergies as of 01/28/2017   No Known Allergies     Medication List        Accurate as of 01/28/17  3:41 PM. Always use your most recent med list.          amLODipine 5 MG tablet Commonly known as:  NORVASC   ANUCORT-HC 25 MG suppository Generic drug:  hydrocortisone Place 1 suppository rectally 2 (two) times daily as needed.   folic acid 1 MG tablet Commonly known as:  FOLVITE   latanoprost 0.005 % ophthalmic solution Commonly known as:  XALATAN Place 1 drop into both eyes at bedtime.   lidocaine 5 % ointment Commonly known as:  XYLOCAINE Apply 1 application topically as needed.   mesalamine 1.2 g EC tablet Commonly known as:  LIALDA Take 1.2 g by mouth 2 (two) times daily. Take 2 tablet by mouth every morning.   rivaroxaban 20 MG Tabs tablet Commonly known as:  XARELTO Take 1 tablet (20 mg total) by mouth daily with  supper.   STOOL SOFTENER 100 MG capsule Generic drug:  docusate sodium Take 1 capsule by mouth every 12 (twelve) hours as needed. Reported on 03/07/2015       Allergies: No Known Allergies  Past Medical History, Surgical history, Social history, and Family History were reviewed and updated.  Review of Systems: All other 10 point review of systems is negative.   Physical Exam:  weight is 307 lb (139.3 kg) (abnormal). His oral temperature is 98.3 F (36.8 C). His blood pressure is 144/72 (abnormal) and his pulse is 60. His respiration is 16 and oxygen saturation is 100%.   Wt Readings from Last 3 Encounters:  01/28/17 (!) 307 lb (139.3 kg)  09/11/16 295 lb (133.8 kg)  05/28/16 (!) 307 lb 12.8 oz (139.6 kg)    Ocular: Sclerae unicteric, pupils equal, round and reactive to light Ear-nose-throat: Oropharynx clear, dentition fair Lymphatic: No cervical, supraclavicular or axillary adenopathy Lungs no rales or rhonchi, good excursion bilaterally Heart regular rate and rhythm, no murmur appreciated Abd soft, nontender, positive bowel sounds, no liver or spleen tip palpated on exam, no fluid wave  MSK no focal spinal tenderness, no joint edema Neuro: non-focal, well-oriented, appropriate affect Breasts: Deferred   Lab Results  Component Value Date   WBC 6.0 01/28/2017   HGB 14.3 01/28/2017   HCT 43.6 01/28/2017   MCV 80 (L) 01/28/2017  PLT 165 01/28/2017   No results found for: FERRITIN, IRON, TIBC, UIBC, IRONPCTSAT Lab Results  Component Value Date   RBC 5.43 01/28/2017   No results found for: KPAFRELGTCHN, LAMBDASER, KAPLAMBRATIO No results found for: IGGSERUM, IGA, IGMSERUM No results found for: Marda StalkerOTALPROTELP, ALBUMINELP, A1GS, A2GS, BETS, BETA2SER, GAMS, MSPIKE, SPEI   Chemistry      Component Value Date/Time   NA 142 01/28/2017 1447   NA 137 09/11/2016 1104   K 3.9 01/28/2017 1447   K 4.0 09/11/2016 1104   CL 103 01/28/2017 1447   CO2 29 01/28/2017 1447   CO2 25  09/11/2016 1104   BUN 8 01/28/2017 1447   BUN 12.5 09/11/2016 1104   CREATININE 0.8 01/28/2017 1447   CREATININE 0.9 09/11/2016 1104      Component Value Date/Time   CALCIUM 8.9 01/28/2017 1447   CALCIUM 9.5 09/11/2016 1104   ALKPHOS 112 (H) 01/28/2017 1447   ALKPHOS 91 09/11/2016 1104   AST 22 01/28/2017 1447   AST 21 09/11/2016 1104   ALT 31 01/28/2017 1447   ALT 18 09/11/2016 1104   BILITOT 0.60 01/28/2017 1447   BILITOT 0.46 09/11/2016 1104      Impression and Plan: [name] is a very pleasant 61 yo African American gentleman with history of recurrent DVT and positive lupus anticoagulant. He is doing well on lifelong anticoagulation with Xarelto 20 mg PO daily. He is asymptomatic and has no complaints at this time.  We will plan to see him back in another 4 months for follow-up.  He will contact our office with any questions or concerns. We can certainly see him sooner if need be.   Emeline GinsSarah Alma Muegge, NP 12/27/20183:41 PM

## 2017-01-29 LAB — D-DIMER, QUANTITATIVE (NOT AT ARMC): D-DIMER: 0.35 mg{FEU}/L (ref 0.00–0.49)

## 2017-01-30 LAB — LUPUS ANTICOAGULANT PANEL
DRVVT: 49.3 s — AB (ref 0.0–47.0)
PTT-LA: 30.5 s (ref 0.0–51.9)
dRVVT Mix: 42.7 s (ref 0.0–47.0)

## 2017-04-04 ENCOUNTER — Other Ambulatory Visit: Payer: Self-pay | Admitting: Internal Medicine

## 2017-04-04 ENCOUNTER — Other Ambulatory Visit: Payer: Self-pay | Admitting: Hematology & Oncology

## 2017-04-24 DIAGNOSIS — R300 Dysuria: Secondary | ICD-10-CM | POA: Diagnosis not present

## 2017-04-24 DIAGNOSIS — Z113 Encounter for screening for infections with a predominantly sexual mode of transmission: Secondary | ICD-10-CM | POA: Diagnosis not present

## 2017-04-24 DIAGNOSIS — N3090 Cystitis, unspecified without hematuria: Secondary | ICD-10-CM | POA: Diagnosis not present

## 2017-05-13 ENCOUNTER — Encounter: Payer: BLUE CROSS/BLUE SHIELD | Admitting: Internal Medicine

## 2017-05-19 IMAGING — US US EXTREM LOW VENOUS*L*
1 series · 13 of 24 positions shown · non-contrast
Comparison: 05/27/2014

CLINICAL DATA: Followup of DVT in the left tibioperoneal trunk with
persistent residual left ankle edema. The patient is on
anticoagulants.



[Series 1: us extrem low venous*left* · 0.08mm/px · 13 of 33 slices shown]
[im 1/33]
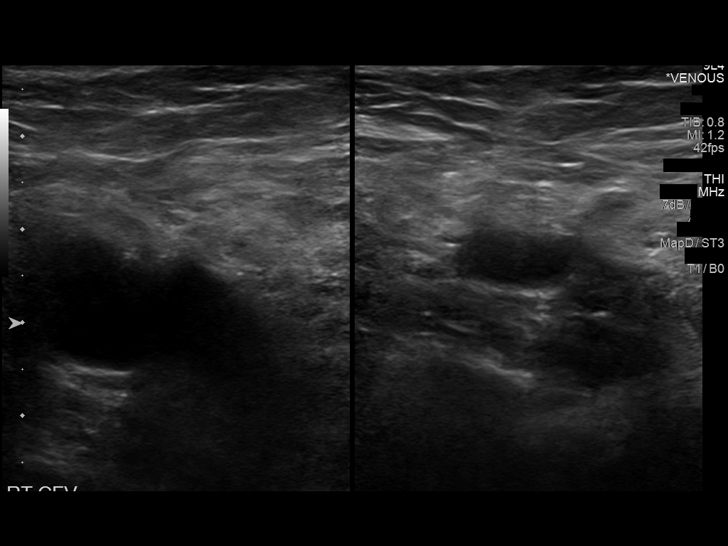
[im 3/33]
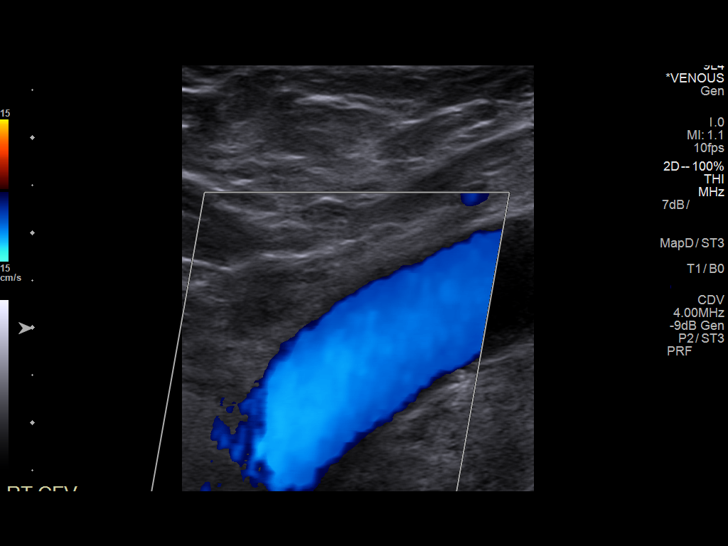
[im 6/33]
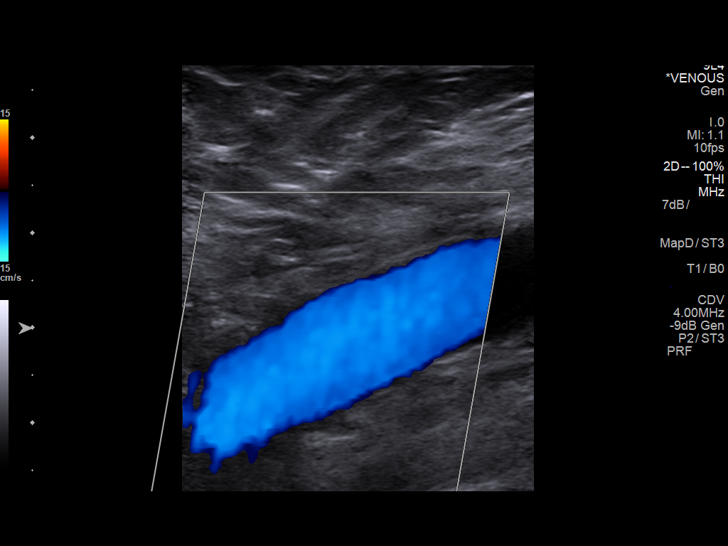
[im 9/33]
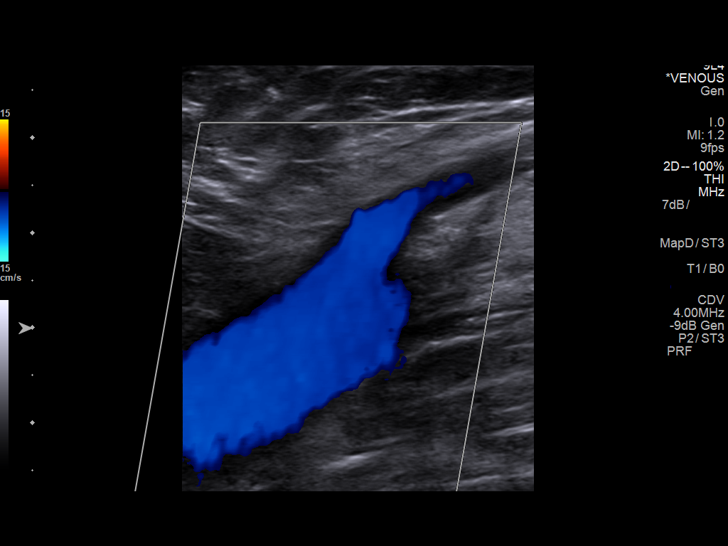
[im 12/33]
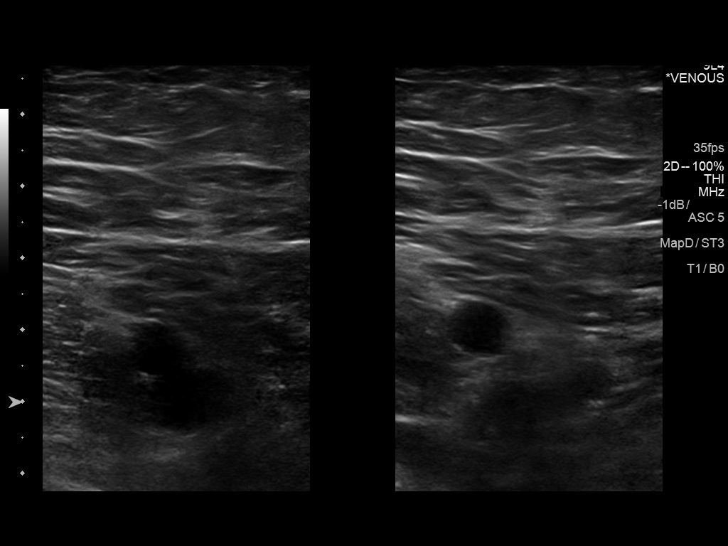
[im 14/33]
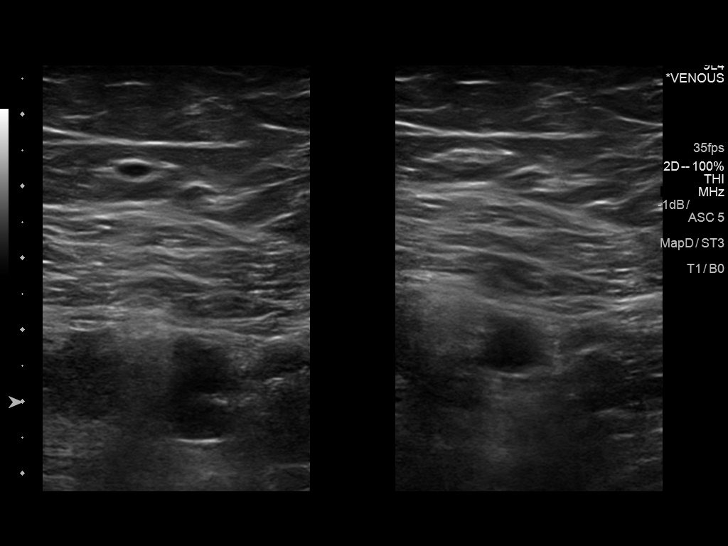
[im 17/33]
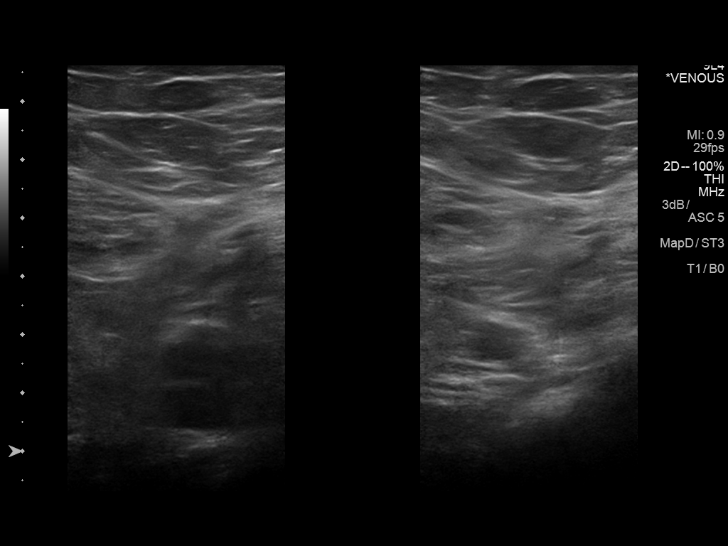
[im 19/33]
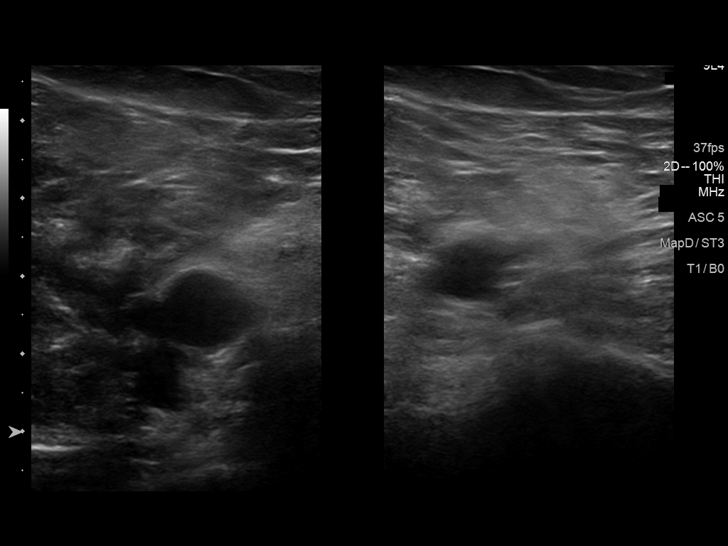
[im 21/33]
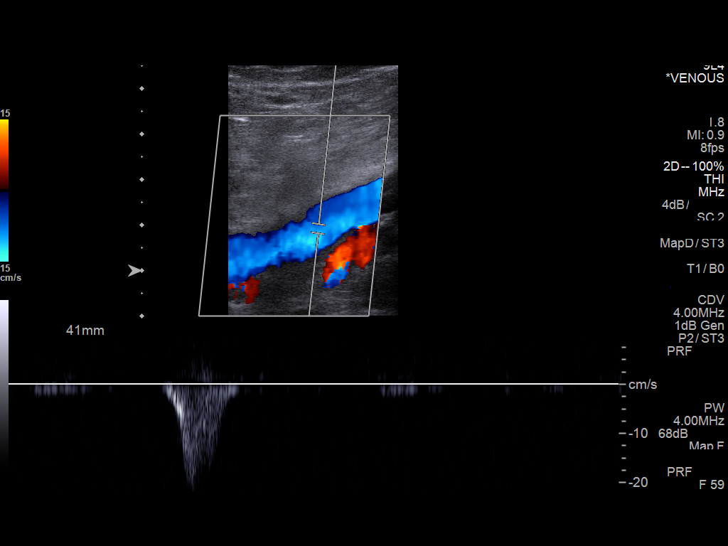
[im 24/33]
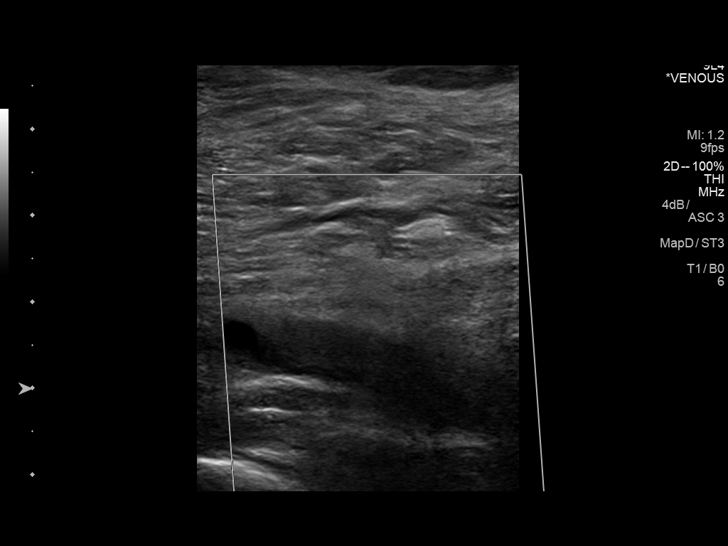
[im 27/33]
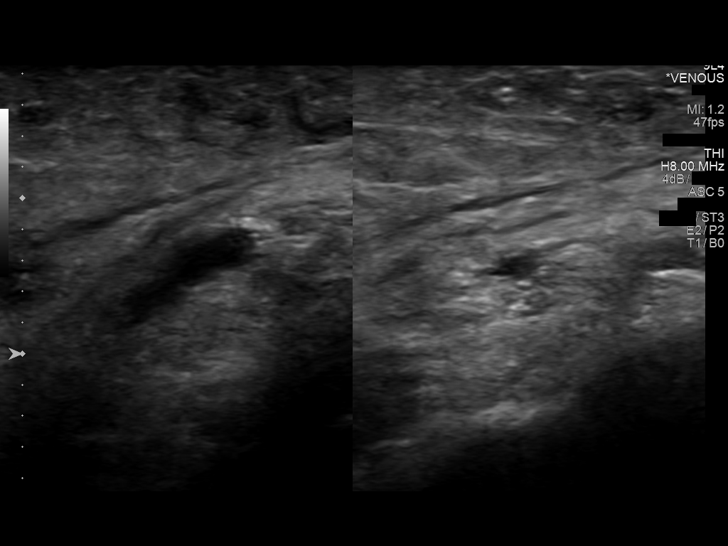
[im 30/33]
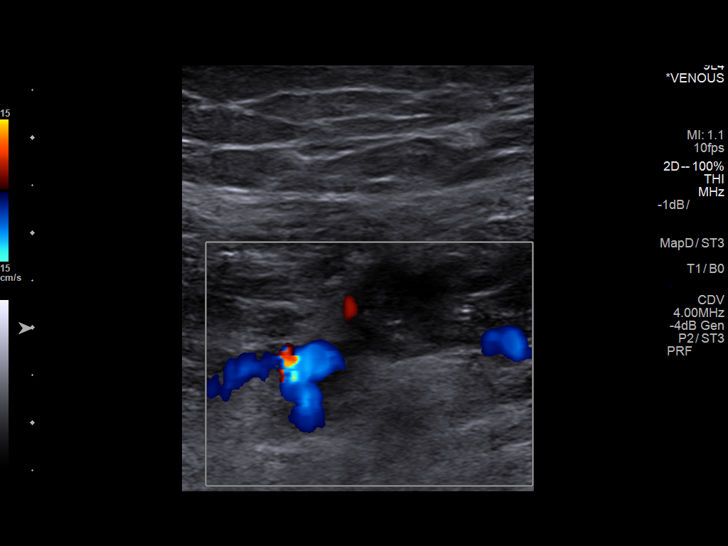
[im 33/33]
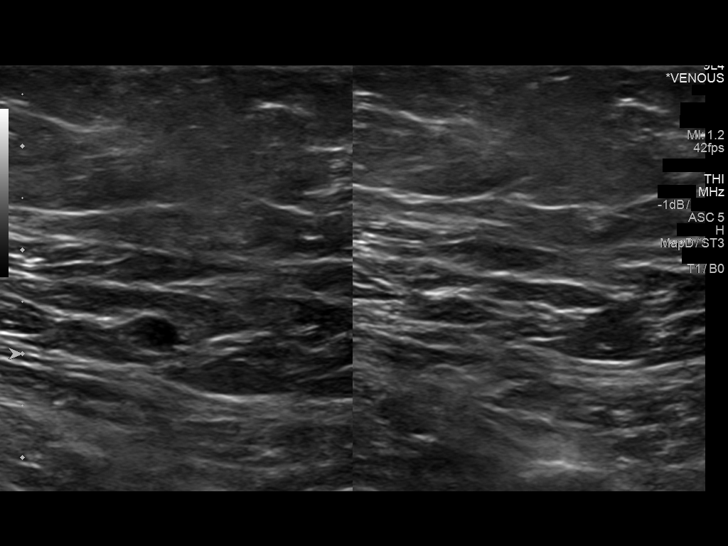

[13 of 24 positions shown; findings below may reference images not displayed]

FINDINGS: Contralateral Common Femoral Vein: Respiratory phasicity is normal
and symmetric with the symptomatic side. No evidence of thrombus.
Normal compressibility.

Common Femoral Vein: No evidence of thrombus. Normal
compressibility, respiratory phasicity and response to augmentation.

Saphenofemoral Junction: No evidence of thrombus. Normal
compressibility and flow on color Doppler imaging.

Profunda Femoral Vein: No evidence of thrombus. Normal
compressibility and flow on color Doppler imaging.

Femoral Vein: No evidence of thrombus. Normal compressibility,
respiratory phasicity and response to augmentation.

Popliteal Vein: No evidence of thrombus. Normal compressibility,
respiratory phasicity and response to augmentation.

Calf Veins: Thrombus within the left tibioperoneal trunk appears
recanalized with partial flow now present. There is a component of
residual adherent chronic mural thrombus which narrows the vein
lumen, but is not obstructive. Visualized segments of the tibial
veins of the calf show normal patency.

Superficial Great Saphenous Vein: No evidence of thrombus. Normal
compressibility and flow on color Doppler imaging.

Venous Reflux:  None.

Other Findings:  None.
IMPRESSION: Interval recanalization of left tibioperoneal trunk DVT with patent
flow present. Component of residual adherent chronic mural thrombus
is present. No new/acute DVT identified.

## 2017-05-27 ENCOUNTER — Inpatient Hospital Stay: Payer: BLUE CROSS/BLUE SHIELD

## 2017-05-27 ENCOUNTER — Inpatient Hospital Stay: Payer: BLUE CROSS/BLUE SHIELD | Attending: Hematology & Oncology | Admitting: Hematology & Oncology

## 2017-05-27 VITALS — BP 141/59 | HR 59 | Temp 98.2°F | Resp 18 | Wt 295.8 lb

## 2017-05-27 DIAGNOSIS — Z86718 Personal history of other venous thrombosis and embolism: Secondary | ICD-10-CM

## 2017-05-27 DIAGNOSIS — I82542 Chronic embolism and thrombosis of left tibial vein: Secondary | ICD-10-CM

## 2017-05-27 DIAGNOSIS — Z7901 Long term (current) use of anticoagulants: Secondary | ICD-10-CM | POA: Diagnosis not present

## 2017-05-27 DIAGNOSIS — D6862 Lupus anticoagulant syndrome: Secondary | ICD-10-CM | POA: Insufficient documentation

## 2017-05-27 DIAGNOSIS — I82409 Acute embolism and thrombosis of unspecified deep veins of unspecified lower extremity: Secondary | ICD-10-CM

## 2017-05-27 DIAGNOSIS — I82541 Chronic embolism and thrombosis of right tibial vein: Secondary | ICD-10-CM | POA: Insufficient documentation

## 2017-05-27 LAB — CMP (CANCER CENTER ONLY)
ALT: 28 U/L (ref 10–47)
ANION GAP: 9 (ref 5–15)
AST: 28 U/L (ref 11–38)
Albumin: 3.6 g/dL (ref 3.5–5.0)
Alkaline Phosphatase: 83 U/L (ref 26–84)
BUN: 13 mg/dL (ref 7–22)
CO2: 28 mmol/L (ref 18–33)
Calcium: 9.4 mg/dL (ref 8.0–10.3)
Chloride: 103 mmol/L (ref 98–108)
Creatinine: 0.9 mg/dL (ref 0.60–1.20)
GLUCOSE: 122 mg/dL — AB (ref 73–118)
Potassium: 3.8 mmol/L (ref 3.3–4.7)
SODIUM: 140 mmol/L (ref 128–145)
TOTAL PROTEIN: 7.2 g/dL (ref 6.4–8.1)
Total Bilirubin: 0.9 mg/dL (ref 0.2–1.6)

## 2017-05-27 LAB — CBC WITH DIFFERENTIAL (CANCER CENTER ONLY)
Basophils Absolute: 0 10*3/uL (ref 0.0–0.1)
Basophils Relative: 1 %
EOS PCT: 3 %
Eosinophils Absolute: 0.2 10*3/uL (ref 0.0–0.5)
HCT: 42.5 % (ref 38.7–49.9)
Hemoglobin: 14.1 g/dL (ref 13.0–17.1)
LYMPHS ABS: 2.6 10*3/uL (ref 0.9–3.3)
Lymphocytes Relative: 42 %
MCH: 26.3 pg — AB (ref 28.0–33.4)
MCHC: 33.2 g/dL (ref 32.0–35.9)
MCV: 79.1 fL — AB (ref 82.0–98.0)
MONO ABS: 0.6 10*3/uL (ref 0.1–0.9)
MONOS PCT: 9 %
Neutro Abs: 2.9 10*3/uL (ref 1.5–6.5)
Neutrophils Relative %: 45 %
PLATELETS: 157 10*3/uL (ref 145–400)
RBC: 5.37 MIL/uL (ref 4.20–5.70)
RDW: 15.2 % (ref 11.1–15.7)
WBC: 6.2 10*3/uL (ref 4.0–10.0)

## 2017-05-27 LAB — D-DIMER, QUANTITATIVE (NOT AT ARMC): D DIMER QUANT: 0.4 ug{FEU}/mL (ref 0.00–0.50)

## 2017-05-27 NOTE — Progress Notes (Signed)
Hematology and Oncology Follow Up Visit  HAROUT SCHEURICH [id number] [birth year] 62 y.o. 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 Thueson is here today for follow-up. He is doing well and has no complaints at this time.   His son graduate from [place] this May.  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  He is doing well on the Xarelto.  He is on lifelong Xarelto.  There is been no issues with nausea or vomiting.  He has had no bleeding.  Has had no change in bowel or bladder habits.  There is been no headache.  He has had no cough or shortness of breath.  There is been no chest wall pain.  His last Doppler was done about a year ago.  He has a stable thrombus in the right posterior tibial vein.  This is chronic.  He also has a chronic thrombus in the left tibial peritoneal trunk.    Overall count was performed status is ECOG 0.   Medications:  Allergies as of 05/27/2017   No Known Allergies     Medication List        Accurate as of 05/27/17 10:45 AM. Always use your most recent med list.          amLODipine 5 MG tablet Commonly known as:  NORVASC Take 1 tablet (5 mg total) by mouth daily.   folic acid 1 MG tablet Commonly known as:  FOLVITE TAKE 1 TABLET BY MOUTH EVERY DAY   latanoprost 0.005 % ophthalmic solution Commonly known as:  XALATAN Place 1 drop into both eyes at bedtime.   mesalamine 1.2 g EC tablet Commonly known as:  LIALDA Take 1.2 g by mouth 2 (two) times daily. Take 2 tablet by mouth every morning.   rivaroxaban 20 MG Tabs tablet Commonly known as:  XARELTO Take 1 tablet (20 mg total) by mouth daily with supper.   STOOL SOFTENER 100 MG capsule Generic drug:  docusate sodium Take 1  capsule by mouth every 12 (twelve) hours as needed. Reported on 03/07/2015       Allergies: No Known Allergies  Past Medical History, Surgical history, Social history, and Family History were reviewed and updated.  Review of Systems: Review of Systems  Constitutional: Negative.   HENT: Negative.   Eyes: Negative.   Respiratory: Negative.   Cardiovascular: Negative.   Gastrointestinal: Negative.   Genitourinary: Negative.   Musculoskeletal: Negative.   Skin: Negative.   Neurological: Negative.   Endo/Heme/Allergies: Negative.   Psychiatric/Behavioral: Negative.      Physical Exam:  weight is 295 lb 12 oz (134.2 kg). His oral temperature is 98.2 F (36.8 C). His blood pressure is 141/59 (abnormal) and his pulse is 59 (abnormal). His respiration is 18 and oxygen saturation is 100%.   Wt Readings from Last 3 Encounters:  05/27/17 295 lb 12 oz (134.2 kg)  01/28/17 (!) 307 lb (139.3 kg)  09/11/16 295 lb (133.8 kg)    Physical Exam  Constitutional: He is oriented to person, place, and time.  HENT:  Head: Normocephalic and atraumatic.  Mouth/Throat: Oropharynx is clear and moist.  Eyes: Pupils are equal, round, and reactive to light. EOM are normal.  Neck: Normal range of motion.  Cardiovascular: Normal  rate, regular rhythm and normal heart sounds.  Pulmonary/Chest: Effort normal and breath sounds normal.  Abdominal: Soft. Bowel sounds are normal.  Musculoskeletal: Normal range of motion. He exhibits no edema, tenderness or deformity.  Lymphadenopathy:    He has no cervical adenopathy.  Neurological: He is alert and oriented to person, place, and time.  Skin: Skin is warm and dry. No rash noted. No erythema.  Psychiatric: He has a normal mood and affect. His behavior is normal. Judgment and thought content normal.  Vitals reviewed.    Lab Results  Component Value Date   WBC 6.2 05/27/2017   HGB 14.1 05/27/2017   HCT 42.5 05/27/2017   MCV 79.1 (L) 05/27/2017   PLT  157 05/27/2017   No results found for: FERRITIN, IRON, TIBC, UIBC, IRONPCTSAT Lab Results  Component Value Date   RBC 5.37 05/27/2017   No results found for: KPAFRELGTCHN, LAMBDASER, KAPLAMBRATIO No results found for: IGGSERUM, IGA, IGMSERUM No results found for: Marda StalkerOTALPROTELP, ALBUMINELP, A1GS, A2GS, BETS, BETA2SER, GAMS, MSPIKE, SPEI   Chemistry      Component Value Date/Time   NA 140 05/27/2017 0946   NA 142 01/28/2017 1447   NA 137 09/11/2016 1104   K 3.8 05/27/2017 0946   K 3.9 01/28/2017 1447   K 4.0 09/11/2016 1104   CL 103 05/27/2017 0946   CL 103 01/28/2017 1447   CO2 28 05/27/2017 0946   CO2 29 01/28/2017 1447   CO2 25 09/11/2016 1104   BUN 13 05/27/2017 0946   BUN 8 01/28/2017 1447   BUN 12.5 09/11/2016 1104   CREATININE 0.90 05/27/2017 0946   CREATININE 0.8 01/28/2017 1447   CREATININE 0.9 09/11/2016 1104      Component Value Date/Time   CALCIUM 9.4 05/27/2017 0946   CALCIUM 8.9 01/28/2017 1447   CALCIUM 9.5 09/11/2016 1104   ALKPHOS 83 05/27/2017 0946   ALKPHOS 112 (H) 01/28/2017 1447   ALKPHOS 91 09/11/2016 1104   AST 28 05/27/2017 0946   AST 21 09/11/2016 1104   ALT 28 05/27/2017 0946   ALT 31 01/28/2017 1447   ALT 18 09/11/2016 1104   BILITOT 0.9 05/27/2017 0946   BILITOT 0.46 09/11/2016 1104      Impression and Plan: [name] is a very pleasant 62 yo African American gentleman with history of recurrent DVT and positive lupus anticoagulant. He is doing well on lifelong anticoagulation with Xarelto 20 mg PO daily.   He is asymptomatic and has no complaints at this time.   We will plan to see him back in another 6 months for follow-up.   He will contact our office with any questions or concerns. We can certainly see him sooner if need be.   Josph MachoPeter R Ennever, MD 4/25/201910:45 AM

## 2017-05-29 LAB — LUPUS ANTICOAGULANT PANEL
DRVVT: 78.3 s — ABNORMAL HIGH (ref 0.0–47.0)
PTT LA: 37.2 s (ref 0.0–51.9)

## 2017-05-29 LAB — DRVVT CONFIRM: dRVVT Confirm: 1.6 ratio — ABNORMAL HIGH (ref 0.8–1.2)

## 2017-05-29 LAB — DRVVT MIX: DRVVT MIX: 56.7 s — AB (ref 0.0–47.0)

## 2017-06-25 ENCOUNTER — Other Ambulatory Visit: Payer: Self-pay | Admitting: Internal Medicine

## 2017-06-25 ENCOUNTER — Other Ambulatory Visit: Payer: Self-pay | Admitting: Hematology & Oncology

## 2017-07-09 ENCOUNTER — Ambulatory Visit (INDEPENDENT_AMBULATORY_CARE_PROVIDER_SITE_OTHER): Payer: BLUE CROSS/BLUE SHIELD | Admitting: Internal Medicine

## 2017-07-09 ENCOUNTER — Encounter

## 2017-07-09 ENCOUNTER — Encounter: Payer: Self-pay | Admitting: Internal Medicine

## 2017-07-09 VITALS — BP 140/78 | HR 49 | Temp 98.2°F | Resp 16 | Ht 74.0 in | Wt 283.0 lb

## 2017-07-09 DIAGNOSIS — Z23 Encounter for immunization: Secondary | ICD-10-CM | POA: Diagnosis not present

## 2017-07-09 DIAGNOSIS — Z0000 Encounter for general adult medical examination without abnormal findings: Secondary | ICD-10-CM | POA: Diagnosis not present

## 2017-07-09 DIAGNOSIS — Z125 Encounter for screening for malignant neoplasm of prostate: Secondary | ICD-10-CM

## 2017-07-09 DIAGNOSIS — R739 Hyperglycemia, unspecified: Secondary | ICD-10-CM | POA: Insufficient documentation

## 2017-07-09 LAB — LIPID PANEL
CHOL/HDL RATIO: 3
Cholesterol: 129 mg/dL (ref 0–200)
HDL: 39.3 mg/dL (ref >39.00)
LDL CALC: 81 mg/dL (ref 0–99)
NonHDL: 89.31
TRIGLYCERIDES: 42 mg/dL (ref 0.0–149.0)
VLDL: 8.4 mg/dL (ref 0.0–40.0)

## 2017-07-09 LAB — HEMOGLOBIN A1C: HEMOGLOBIN A1C: 5.9 % (ref 4.6–6.5)

## 2017-07-09 LAB — PSA: PSA: 1.53 ng/mL (ref 0.10–4.00)

## 2017-07-09 NOTE — Progress Notes (Signed)
Subjective:    Patient ID: Mitchell Thornton, male    DOB: 27-Jun-1955, 62 y.o.   MRN: [id number]  DOS:  07/09/2017 Type of visit - description : cpx Interval history: Patient with no major concerns, he feels well.   Wt Readings from Last 3 Encounters:  07/09/17 283 lb (128.4 kg)  05/27/17 295 lb 12 oz (134.2 kg)  01/28/17 (!) 307 lb (139.3 kg)    Review of Systems specifically denies chest pain, difficulty breathing, blood in the stools and blood in the urine.  A 14 point review of systems is negative   Past Medical History:  Diagnosis Date  . Colitis    Cscope ~ 2007, L sided, Rx lialda  . Glaucoma     Past Surgical History:  Procedure Laterality Date  . CYSTECTOMY     from leg     Social History   Socioeconomic History  . Marital status: Married    Spouse name: Not on file  . Number of children: 3  . Years of education: Not on file  . Highest education level: Not on file  Occupational History  . Occupation: Information systems manager office   Social Needs  . Financial resource strain: Not on file  . Food insecurity:    Worry: Not on file    Inability: Not on file  . Transportation needs:    Medical: Not on file    Non-medical: Not on file  Tobacco Use  . Smoking status: Never Smoker  . Smokeless tobacco: Never Used  . Tobacco comment: NEVER USED TOBACCO  Substance and Sexual Activity  . Alcohol use: No    Alcohol/week: 0.0 oz  . Drug use: No  . Sexual activity: Not on file  Lifestyle  . Physical activity:    Days per week: Not on file    Minutes per session: Not on file  . Stress: Not on file  Relationships  . Social connections:    Talks on phone: Not on file    Gets together: Not on file    Attends religious service: Not on file    Active member of club or organization: Not on file    Attends meetings of clubs or organizations: Not on file    Relationship status: Not on file  . Intimate partner violence:    Fear of current or ex partner: Not on file     Emotionally abused: Not on file    Physically abused: Not on file    Forced sexual activity: Not on file  Other Topics Concern  . Not on file  Social History Narrative   Original from [place], moved to the [place] in 1982, moved to [place] ~ 1995   Household- pt, wife, 3 children           Family History  Problem Relation Age of Onset  . Diabetes Mother   . Deep vein thrombosis Mother        "clots" mother   . Hypertension Mother   . Prostate cancer Father 52  . Aneurysm Father        brain aneurysm  . Coronary artery disease Neg Hx   . Stroke Neg Hx   . Colon cancer Neg Hx      Allergies as of 07/09/2017   No Known Allergies     Medication List        Accurate as of 07/09/17 11:59 PM. Always use your most recent med list.  amLODipine 5 MG tablet Commonly known as:  NORVASC Take 1 tablet (5 mg total) by mouth daily.   folic acid 1 MG tablet Commonly known as:  FOLVITE TAKE 1 TABLET BY MOUTH EVERY DAY   latanoprost 0.005 % ophthalmic solution Commonly known as:  XALATAN Place 1 drop into both eyes at bedtime.   mesalamine 1.2 g EC tablet Commonly known as:  LIALDA Take 1.2 g by mouth 2 (two) times daily. Take 2 tablet by mouth every morning.   rivaroxaban 20 MG Tabs tablet Commonly known as:  XARELTO Take 1 tablet (20 mg total) by mouth daily with supper.          Objective:   Physical Exam BP 140/78 (BP Location: Left Arm, Patient Position: Sitting, Cuff Size: Large)   Pulse (!) 49   Temp 98.2 F (36.8 C) (Oral)   Resp 16   Ht 6\' 2"  (1.88 m)   Wt 283 lb (128.4 kg)   SpO2 100%   BMI 36.34 kg/m  General:   Well developed, obese appearing. NAD.  Neck: No  thyromegaly  HEENT:  Normocephalic . Face symmetric, atraumatic Lungs:  CTA B Normal respiratory effort, no intercostal retractions, no accessory muscle use. Heart: RRR,  no murmur.  Trace pretibial edema bilaterally  Abdomen:  Not distended, soft, non-tender. No rebound or rigidity.    Rectal: External abnormalities: none. Normal sphincter tone. No rectal masses or tenderness.  No stools found Prostate: Prostate gland exam quite limited, otherwise normal. Skin: Exposed areas without rash. Not pale. Not jaundice Neurologic:  alert & oriented X3.  Speech normal, gait appropriate for age and unassisted Strength symmetric and appropriate for age.  Psych: Cognition and judgment appear intact.  Cooperative with normal attention span and concentration.  Behavior appropriate. No anxious or depressed appearing.     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 Hemorrhoids   PLAN: Hypergliycemia: Doing well with lifestyle, checking A1c HTN on amlodipine, trace edema on exam, recommend ambulatory BPs UC: Asymptomatic, follow-up per [facility] + Lupus anticoagulant: On Xarelto, no apparent side effects. RTC 1 year

## 2017-07-09 NOTE — Patient Instructions (Signed)
GO TO THE LAB : Get the blood work     GO TO THE FRONT DESK Schedule your next appointment for a sickle exam in 1 year    Check the  blood pressure 2 or 3 times a  monthly   Be sure your blood pressure is between 110/65 and  135/85. If it is consistently higher or lower, let me know

## 2017-07-09 NOTE — Assessment & Plan Note (Addendum)
-   Td 07/2017 -[facility]: Cscope 9-12, reports Cscope 01-2016, per GI note next Cscope 01-2018  -- prostate ca screening DRE limited but normal today, check a PSA . --Diet, exercise discussed, he is actually doing better, more active, has lost some weight --Previous labs reviewed, due for a FLP, A1c and PSA   -PHQ 9: 0

## 2017-07-11 NOTE — Assessment & Plan Note (Signed)
Hypergliycemia: Doing well with lifestyle, checking A1c HTN on amlodipine, trace edema on exam, recommend ambulatory BPs UC: Asymptomatic, follow-up per [facility] + Lupus anticoagulant: On Xarelto, no apparent side effects. RTC 1 year

## 2017-09-15 DIAGNOSIS — H401132 Primary open-angle glaucoma, bilateral, moderate stage: Secondary | ICD-10-CM | POA: Diagnosis not present

## 2017-09-20 ENCOUNTER — Other Ambulatory Visit: Payer: Self-pay | Admitting: Hematology & Oncology

## 2017-09-28 ENCOUNTER — Other Ambulatory Visit: Payer: Self-pay | Admitting: Internal Medicine

## 2017-09-28 ENCOUNTER — Other Ambulatory Visit: Payer: Self-pay | Admitting: Hematology & Oncology

## 2017-11-18 ENCOUNTER — Other Ambulatory Visit: Payer: BLUE CROSS/BLUE SHIELD

## 2017-11-18 ENCOUNTER — Ambulatory Visit: Payer: BLUE CROSS/BLUE SHIELD | Admitting: Family

## 2017-12-02 ENCOUNTER — Inpatient Hospital Stay (HOSPITAL_BASED_OUTPATIENT_CLINIC_OR_DEPARTMENT_OTHER): Payer: BLUE CROSS/BLUE SHIELD | Admitting: Family

## 2017-12-02 ENCOUNTER — Inpatient Hospital Stay: Payer: BLUE CROSS/BLUE SHIELD | Attending: Hematology & Oncology

## 2017-12-02 VITALS — BP 147/73 | HR 46 | Temp 98.3°F | Resp 18 | Wt 277.0 lb

## 2017-12-02 DIAGNOSIS — Z79899 Other long term (current) drug therapy: Secondary | ICD-10-CM | POA: Insufficient documentation

## 2017-12-02 DIAGNOSIS — Z7901 Long term (current) use of anticoagulants: Secondary | ICD-10-CM

## 2017-12-02 DIAGNOSIS — D6862 Lupus anticoagulant syndrome: Secondary | ICD-10-CM | POA: Diagnosis not present

## 2017-12-02 DIAGNOSIS — Z86718 Personal history of other venous thrombosis and embolism: Secondary | ICD-10-CM | POA: Diagnosis not present

## 2017-12-02 DIAGNOSIS — I82409 Acute embolism and thrombosis of unspecified deep veins of unspecified lower extremity: Secondary | ICD-10-CM

## 2017-12-02 DIAGNOSIS — I10 Essential (primary) hypertension: Secondary | ICD-10-CM | POA: Insufficient documentation

## 2017-12-02 DIAGNOSIS — I82543 Chronic embolism and thrombosis of tibial vein, bilateral: Secondary | ICD-10-CM

## 2017-12-02 LAB — CBC WITH DIFFERENTIAL (CANCER CENTER ONLY)
Abs Immature Granulocytes: 0 10*3/uL (ref 0.00–0.07)
BASOS ABS: 0 10*3/uL (ref 0.0–0.1)
Basophils Relative: 0 %
Eosinophils Absolute: 0.1 10*3/uL (ref 0.0–0.5)
Eosinophils Relative: 3 %
HEMATOCRIT: 46.2 % (ref 39.0–52.0)
HEMOGLOBIN: 14.2 g/dL (ref 13.0–17.0)
IMMATURE GRANULOCYTES: 0 %
LYMPHS ABS: 2.5 10*3/uL (ref 0.7–4.0)
LYMPHS PCT: 44 %
MCH: 25.2 pg — ABNORMAL LOW (ref 26.0–34.0)
MCHC: 30.7 g/dL (ref 30.0–36.0)
MCV: 81.9 fL (ref 80.0–100.0)
Monocytes Absolute: 0.6 10*3/uL (ref 0.1–1.0)
Monocytes Relative: 10 %
NEUTROS PCT: 43 %
NRBC: 0 % (ref 0.0–0.2)
Neutro Abs: 2.4 10*3/uL (ref 1.7–7.7)
Platelet Count: 169 10*3/uL (ref 150–400)
RBC: 5.64 MIL/uL (ref 4.22–5.81)
RDW: 14.6 % (ref 11.5–15.5)
WBC: 5.6 10*3/uL (ref 4.0–10.5)

## 2017-12-02 LAB — LACTATE DEHYDROGENASE: LDH: 188 U/L (ref 98–192)

## 2017-12-02 LAB — CMP (CANCER CENTER ONLY)
ALBUMIN: 3.9 g/dL (ref 3.5–5.0)
ALK PHOS: 91 U/L (ref 38–126)
ALT: 23 U/L (ref 0–44)
AST: 23 U/L (ref 15–41)
Anion gap: 8 (ref 5–15)
BILIRUBIN TOTAL: 0.5 mg/dL (ref 0.3–1.2)
BUN: 13 mg/dL (ref 6–20)
CO2: 27 mmol/L (ref 22–32)
Calcium: 9.2 mg/dL (ref 8.9–10.3)
Chloride: 102 mmol/L (ref 98–111)
Creatinine: 0.93 mg/dL (ref 0.61–1.24)
GFR, Est AFR Am: >60 mL/min (ref >60)
GFR, Estimated: >60 mL/min (ref >60)
GLUCOSE: 82 mg/dL (ref 70–99)
POTASSIUM: 4.1 mmol/L (ref 3.5–5.1)
SODIUM: 137 mmol/L (ref 135–145)
TOTAL PROTEIN: 7.7 g/dL (ref 6.5–8.1)

## 2017-12-02 NOTE — Progress Notes (Signed)
Hematology and Oncology Follow Up Visit  Mitchell Thornton [id number] [birth year] 62 y.o. 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 He is doing quite well and is excited to share that he has lost almost 20 lbs since his last visit. He is cut down on portion sizes and is going to the gym with his kids 4-5 days a week. He has no complaints at this time.  He continues to take his Noel Christmas daily as prescribed. No episodes of bleeding, no bruising or petechiae.  No fever, chills, n/v, cough, rash, dizziness, SOB, chest pain, palpitations, abdominal pain or changes in bowel or bladder habits.  No swelling, tenderness, numbness or tingling in his extremities. No c/o pain.  No lymphadenopathy noted on exam.   ECOG Performance Status: 1 - Symptomatic but completely ambulatory  Medications:  Allergies as of 12/02/2017   No Known Allergies     Medication List        Accurate as of 12/02/17 11:37 AM. Always use your most recent med list.          amLODipine 5 MG tablet Commonly known as:  NORVASC TAKE 1 TABLET(5 MG) BY MOUTH DAILY   folic acid 1 MG tablet Commonly known as:  FOLVITE TAKE 1 TABLET BY MOUTH EVERY DAY   latanoprost 0.005 % ophthalmic solution Commonly known as:  XALATAN Place 1 drop into both eyes at bedtime.   Latanoprost 0.005 % Emul INT 1 GTT INTO OU HS   mesalamine 1.2 g EC tablet Commonly known as:  LIALDA Take 1.2 g by mouth 2 (two) times daily. Take 2 tablet by mouth every morning.   XARELTO 20 MG Tabs tablet Generic drug:  rivaroxaban TAKE 1 TABLET BY MOUTH DAILY WITH SUPPER       Allergies: No Known Allergies  Past Medical History, Surgical history, Social history, and Family  History were reviewed and updated.  Review of Systems: All other 10 point review of systems is negative.   Physical Exam:  weight is 277 lb (125.6 kg). His oral temperature is 98.3 F (36.8 C). His blood pressure is 147/73 (abnormal) and his pulse is 46 (abnormal). His respiration is 18 and oxygen saturation is 100%.   Wt Readings from Last 3 Encounters:  12/02/17 277 lb (125.6 kg)  07/09/17 283 lb (128.4 kg)  05/27/17 295 lb 12 oz (134.2 kg)    Ocular: Sclerae unicteric, pupils equal, round and reactive to light Ear-nose-throat: Oropharynx clear, dentition fair Lymphatic: No cervical, supraclavicular or axillary adenopathy Lungs no rales or rhonchi, good excursion bilaterally Heart regular rate and rhythm, no murmur appreciated Abd soft, nontender, positive bowel sounds, no liver or spleen tip palpated on exam, no fluid wave  MSK no focal spinal tenderness, no joint edema Neuro: non-focal, well-oriented, appropriate affect Breasts: Deferred   Lab Results  Component Value Date   WBC 5.6 12/02/2017   HGB 14.2 12/02/2017   HCT 46.2 12/02/2017   MCV 81.9 12/02/2017   PLT 169 12/02/2017   No results found for: FERRITIN, IRON, TIBC, UIBC, IRONPCTSAT Lab Results  Component Value Date   RBC 5.64 12/02/2017   No results found for: KPAFRELGTCHN, LAMBDASER, KAPLAMBRATIO No results found for: IGGSERUM, IGA, IGMSERUM No results found  for: Marda Stalker, SPEI   Chemistry      Component Value Date/Time   NA 140 05/27/2017 0946   NA 142 01/28/2017 1447   NA 137 09/11/2016 1104   K 3.8 05/27/2017 0946   K 3.9 01/28/2017 1447   K 4.0 09/11/2016 1104   CL 103 05/27/2017 0946   CL 103 01/28/2017 1447   CO2 28 05/27/2017 0946   CO2 29 01/28/2017 1447   CO2 25 09/11/2016 1104   BUN 13 05/27/2017 0946   BUN 8 01/28/2017 1447   BUN 12.5 09/11/2016 1104   CREATININE 0.90 05/27/2017 0946   CREATININE 0.8 01/28/2017 1447    CREATININE 0.9 09/11/2016 1104      Component Value Date/Time   CALCIUM 9.4 05/27/2017 0946   CALCIUM 8.9 01/28/2017 1447   CALCIUM 9.5 09/11/2016 1104   ALKPHOS 83 05/27/2017 0946   ALKPHOS 112 (H) 01/28/2017 1447   ALKPHOS 91 09/11/2016 1104   AST 28 05/27/2017 0946   AST 21 09/11/2016 1104   ALT 28 05/27/2017 0946   ALT 31 01/28/2017 1447   ALT 18 09/11/2016 1104   BILITOT 0.9 05/27/2017 0946   BILITOT 0.46 09/11/2016 1104      Impression and Plan: [name] is a very pleasant 62 yo African American gentleman with history of recurrent DVT and positive lupus anticoagulant. He is doing well and has no complaints at this time.  He will continue his same regimen with Xarelto daily.  We will see him again in another 6 months.  He will contact our office with any questions or concerns. We can certainly see him sooner if need be.   Emeline Gins, NP 10/31/201911:37 AM

## 2017-12-03 LAB — DRVVT MIX: dRVVT Mix: 44.9 s (ref 0.0–47.0)

## 2017-12-03 LAB — LUPUS ANTICOAGULANT PANEL
DRVVT: 57.2 s — ABNORMAL HIGH (ref 0.0–47.0)
PTT LA: 34.5 s (ref 0.0–51.9)

## 2017-12-08 DIAGNOSIS — K5190 Ulcerative colitis, unspecified, without complications: Secondary | ICD-10-CM | POA: Diagnosis not present

## 2017-12-08 DIAGNOSIS — K5180 Other ulcerative colitis without complications: Secondary | ICD-10-CM | POA: Diagnosis not present

## 2017-12-10 ENCOUNTER — Other Ambulatory Visit: Payer: BLUE CROSS/BLUE SHIELD

## 2017-12-10 ENCOUNTER — Ambulatory Visit: Payer: BLUE CROSS/BLUE SHIELD | Admitting: Hematology & Oncology

## 2017-12-17 ENCOUNTER — Other Ambulatory Visit: Payer: Self-pay | Admitting: Hematology & Oncology

## 2017-12-28 ENCOUNTER — Other Ambulatory Visit: Payer: Self-pay | Admitting: Hematology & Oncology

## 2018-02-08 DIAGNOSIS — K648 Other hemorrhoids: Secondary | ICD-10-CM | POA: Diagnosis not present

## 2018-02-08 DIAGNOSIS — K5289 Other specified noninfective gastroenteritis and colitis: Secondary | ICD-10-CM | POA: Diagnosis not present

## 2018-02-08 DIAGNOSIS — K5190 Ulcerative colitis, unspecified, without complications: Secondary | ICD-10-CM | POA: Diagnosis not present

## 2018-02-08 LAB — HM COLONOSCOPY

## 2018-02-09 DIAGNOSIS — H04123 Dry eye syndrome of bilateral lacrimal glands: Secondary | ICD-10-CM | POA: Diagnosis not present

## 2018-02-09 DIAGNOSIS — H401132 Primary open-angle glaucoma, bilateral, moderate stage: Secondary | ICD-10-CM | POA: Diagnosis not present

## 2018-02-14 ENCOUNTER — Encounter: Payer: Self-pay | Admitting: Internal Medicine

## 2018-03-17 IMAGING — US US EXTREM LOW VENOUS*L*
1 series · 13 of 24 positions shown · non-contrast
Comparison: Prior duplex venous ultrasound 08/20/2014

CLINICAL DATA: 60-year-old male with a history of left calf DVT



[Series 1: us extrem low venous*left* · 0.08mm/px · 13 of 32 slices shown]
[im 1/32]
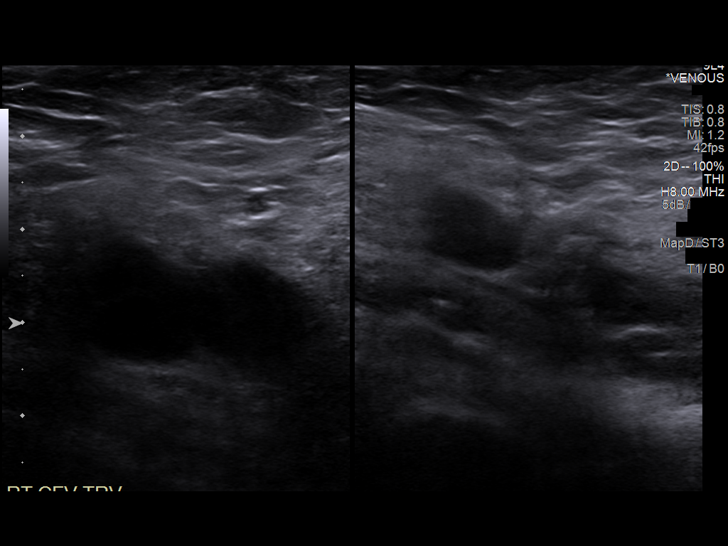
[im 3/32]
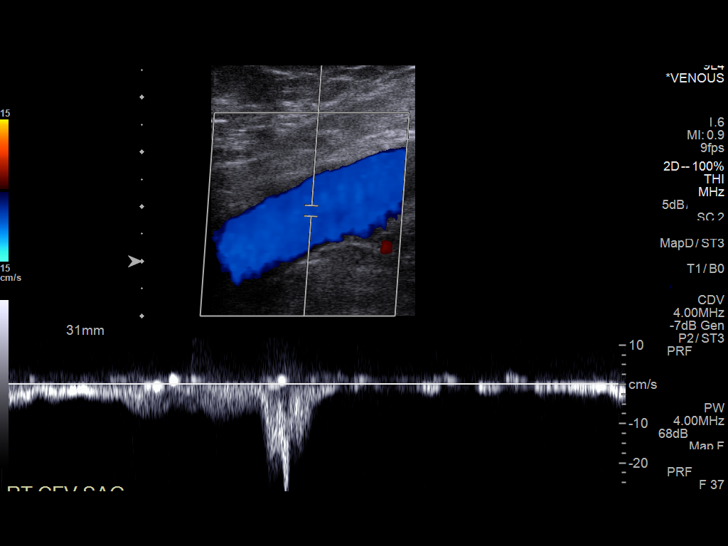
[im 6/32]
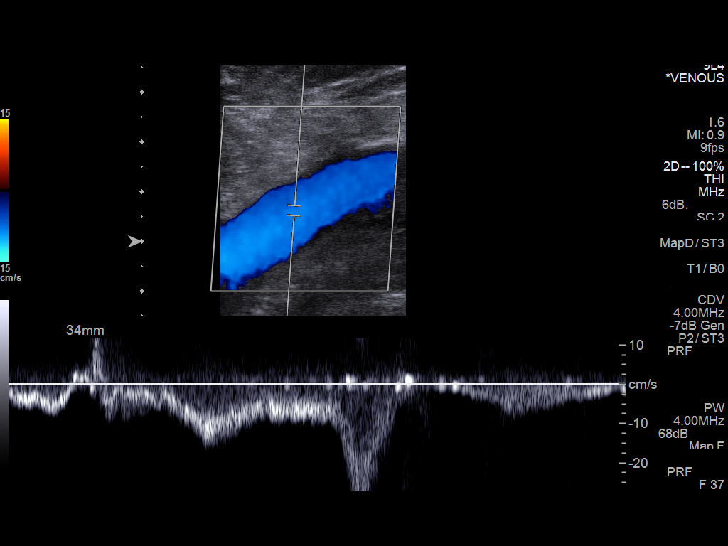
[im 9/32]
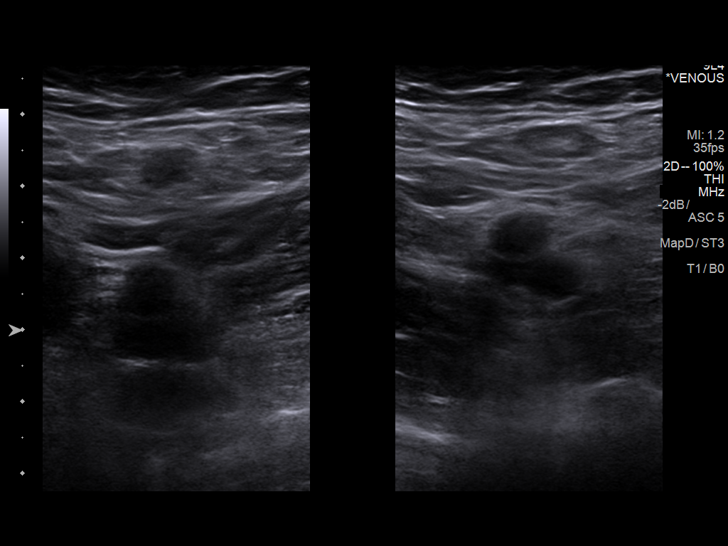
[im 11/32]
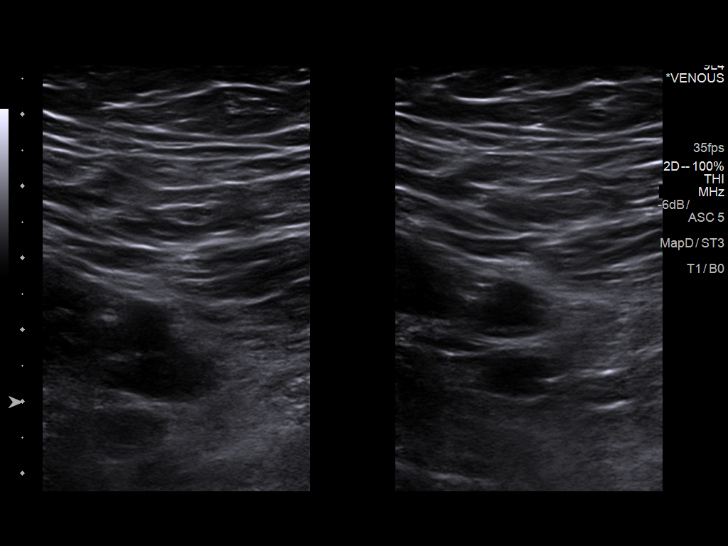
[im 14/32]
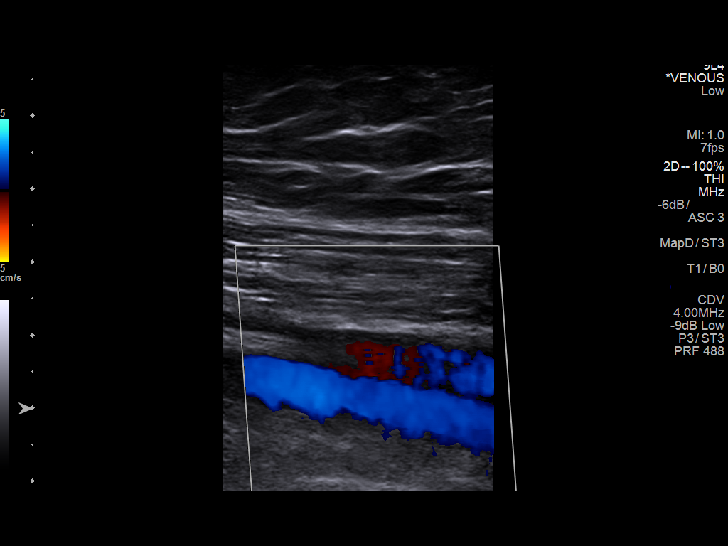
[im 17/32]
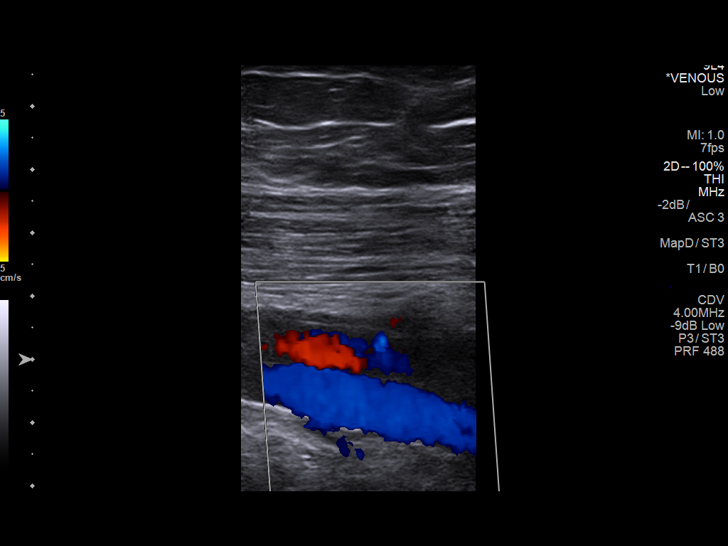
[im 18/32]
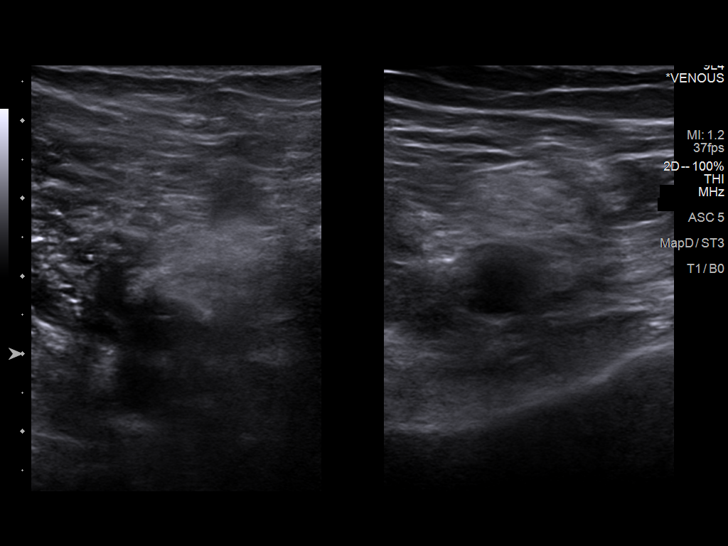
[im 21/32]
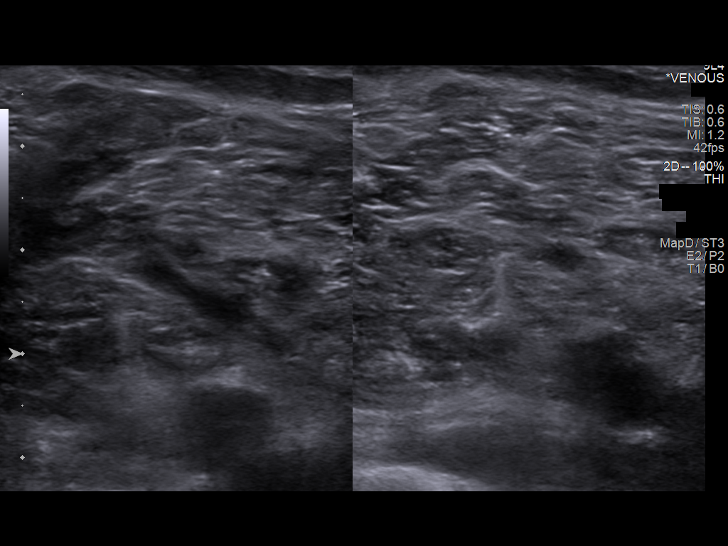
[im 23/32]
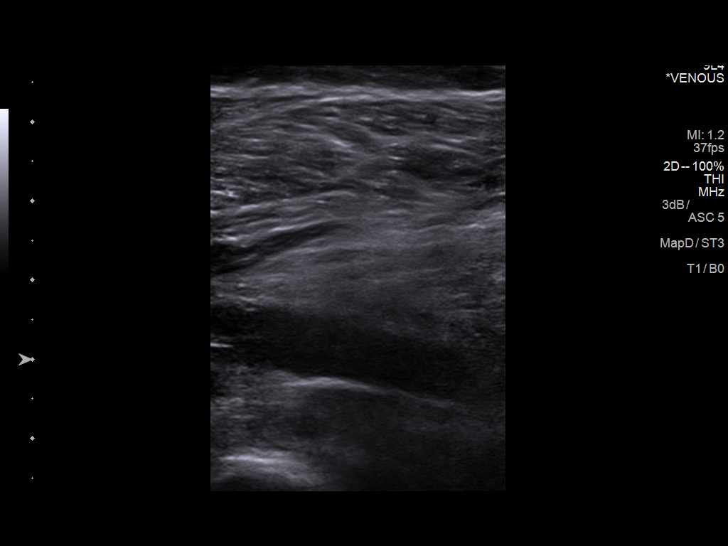
[im 26/32]
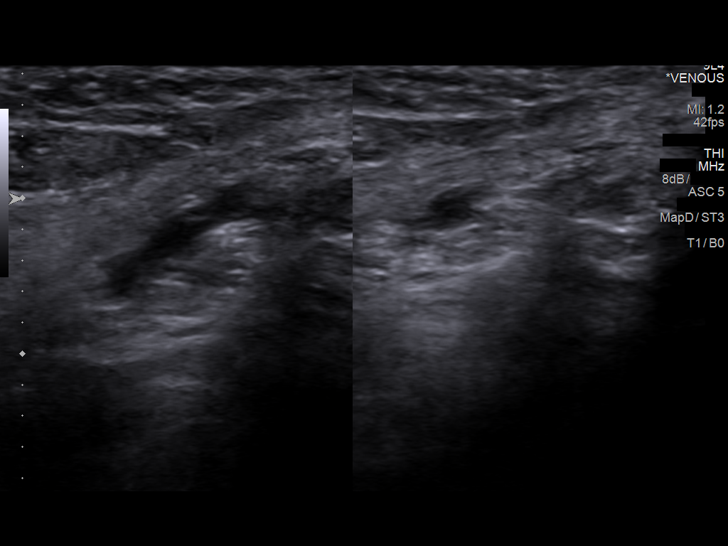
[im 29/32]
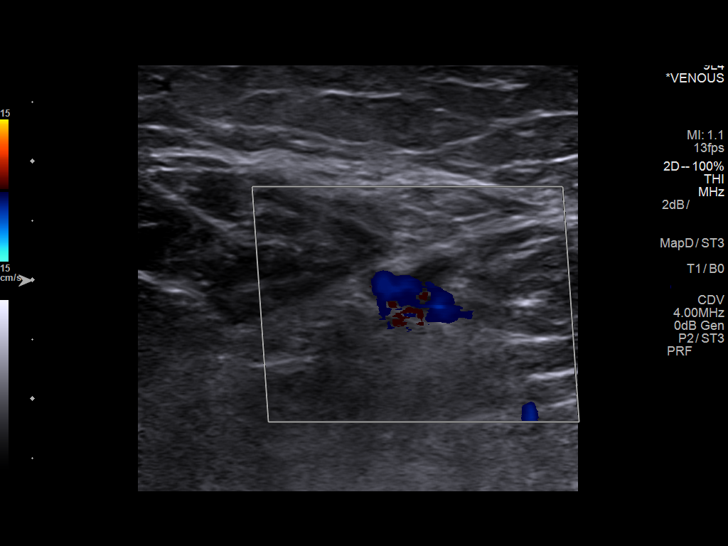
[im 32/32]
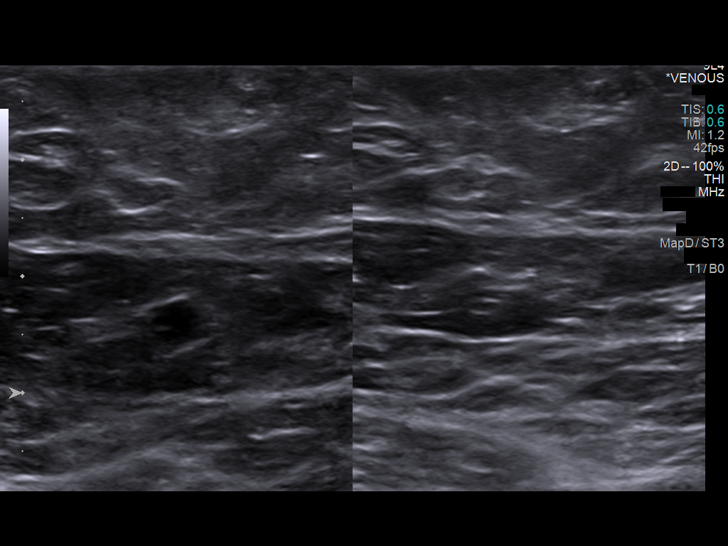

[13 of 24 positions shown; findings below may reference images not displayed]

FINDINGS: Contralateral Common Femoral Vein: Respiratory phasicity is normal
and symmetric with the symptomatic side. No evidence of thrombus.
Normal compressibility.

Common Femoral Vein: No evidence of thrombus. Normal
compressibility, respiratory phasicity and response to augmentation.

Saphenofemoral Junction: No evidence of thrombus. Normal
compressibility and flow on color Doppler imaging.

Profunda Femoral Vein: No evidence of thrombus. Normal
compressibility and flow on color Doppler imaging.

Femoral Vein: No evidence of thrombus. Normal compressibility,
respiratory phasicity and response to augmentation.

Popliteal Vein: No evidence of thrombus. Normal compressibility,
respiratory phasicity and response to augmentation.

Calf Veins: Poorly visualized. No definite acute thrombus. There may
be some residual eccentric wall thickening in the tibioperoneal
trunk.

Superficial Great Saphenous Vein: No evidence of thrombus. Normal
compressibility and flow on color Doppler imaging.

Venous Reflux:  None.

Other Findings:  None.
IMPRESSION: 1. No evidence of acute deep venous thrombosis.
2. Further recanalization of the tibioperoneal trunk with only mild
residual chronic DVT.

## 2018-03-19 ENCOUNTER — Other Ambulatory Visit: Payer: Self-pay | Admitting: Hematology & Oncology

## 2018-03-29 ENCOUNTER — Other Ambulatory Visit: Payer: Self-pay | Admitting: Hematology & Oncology

## 2018-03-29 ENCOUNTER — Other Ambulatory Visit: Payer: Self-pay | Admitting: Internal Medicine

## 2018-04-15 DIAGNOSIS — H6503 Acute serous otitis media, bilateral: Secondary | ICD-10-CM | POA: Diagnosis not present

## 2018-06-02 ENCOUNTER — Inpatient Hospital Stay: Payer: BLUE CROSS/BLUE SHIELD | Attending: Hematology & Oncology | Admitting: Hematology & Oncology

## 2018-06-02 ENCOUNTER — Other Ambulatory Visit: Payer: Self-pay

## 2018-06-02 ENCOUNTER — Inpatient Hospital Stay: Payer: BLUE CROSS/BLUE SHIELD

## 2018-06-02 ENCOUNTER — Encounter: Payer: Self-pay | Admitting: Hematology & Oncology

## 2018-06-02 VITALS — BP 153/70 | HR 55 | Temp 98.0°F | Resp 18 | Wt 288.0 lb

## 2018-06-02 DIAGNOSIS — I82543 Chronic embolism and thrombosis of tibial vein, bilateral: Secondary | ICD-10-CM

## 2018-06-02 DIAGNOSIS — Z86718 Personal history of other venous thrombosis and embolism: Secondary | ICD-10-CM | POA: Diagnosis not present

## 2018-06-02 DIAGNOSIS — Z7901 Long term (current) use of anticoagulants: Secondary | ICD-10-CM | POA: Diagnosis not present

## 2018-06-02 DIAGNOSIS — I82401 Acute embolism and thrombosis of unspecified deep veins of right lower extremity: Secondary | ICD-10-CM | POA: Diagnosis not present

## 2018-06-02 DIAGNOSIS — I82409 Acute embolism and thrombosis of unspecified deep veins of unspecified lower extremity: Secondary | ICD-10-CM

## 2018-06-02 LAB — CMP (CANCER CENTER ONLY)
ALT: 22 U/L (ref 0–44)
AST: 20 U/L (ref 15–41)
Albumin: 4 g/dL (ref 3.5–5.0)
Alkaline Phosphatase: 77 U/L (ref 38–126)
Anion gap: 7 (ref 5–15)
BUN: 10 mg/dL (ref 8–23)
CO2: 27 mmol/L (ref 22–32)
Calcium: 9.1 mg/dL (ref 8.9–10.3)
Chloride: 100 mmol/L (ref 98–111)
Creatinine: 0.83 mg/dL (ref 0.61–1.24)
GFR, Est AFR Am: >60 mL/min (ref >60)
GFR, Estimated: >60 mL/min (ref >60)
Glucose, Bld: 86 mg/dL (ref 70–99)
Potassium: 3.5 mmol/L (ref 3.5–5.1)
Sodium: 134 mmol/L — ABNORMAL LOW (ref 135–145)
Total Bilirubin: 0.5 mg/dL (ref 0.3–1.2)
Total Protein: 7.5 g/dL (ref 6.5–8.1)

## 2018-06-02 LAB — CBC WITH DIFFERENTIAL (CANCER CENTER ONLY)
Abs Immature Granulocytes: 0.01 10*3/uL (ref 0.00–0.07)
Basophils Absolute: 0 10*3/uL (ref 0.0–0.1)
Basophils Relative: 1 %
Eosinophils Absolute: 0.2 10*3/uL (ref 0.0–0.5)
Eosinophils Relative: 3 %
HCT: 45.3 % (ref 39.0–52.0)
Hemoglobin: 14 g/dL (ref 13.0–17.0)
Immature Granulocytes: 0 %
Lymphocytes Relative: 37 %
Lymphs Abs: 2.6 10*3/uL (ref 0.7–4.0)
MCH: 25.7 pg — ABNORMAL LOW (ref 26.0–34.0)
MCHC: 30.9 g/dL (ref 30.0–36.0)
MCV: 83.3 fL (ref 80.0–100.0)
Monocytes Absolute: 0.7 10*3/uL (ref 0.1–1.0)
Monocytes Relative: 10 %
Neutro Abs: 3.4 10*3/uL (ref 1.7–7.7)
Neutrophils Relative %: 49 %
Platelet Count: 160 10*3/uL (ref 150–400)
RBC: 5.44 MIL/uL (ref 4.22–5.81)
RDW: 14.6 % (ref 11.5–15.5)
WBC Count: 6.9 10*3/uL (ref 4.0–10.5)
nRBC: 0 % (ref 0.0–0.2)

## 2018-06-02 LAB — LACTATE DEHYDROGENASE: LDH: 184 U/L (ref 98–192)

## 2018-06-02 NOTE — Progress Notes (Signed)
Hematology and Oncology Follow Up Visit  Mitchell Thornton [id number] 07-26-1955 63 y.o. 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  He is managing so far.  He has put on weight because the gym is not open.  This is a real inconvenience for him.  When he was last here back in October 2019, he was a negative for the lupus anticoagulant.  He has had intermittently positive tests.  He doing well on the Xarelto.  He has had no bleeding.  He has had no leg swelling.  There is been no pain.  He did go down to [place] over the Christmas holidays.  He had a wonderful time down there.  He likes to eat goat meat and enjoys it.  He even gets [place] in [place].  Overall, his performance status is ECOG 0.   Medications:  Allergies as of 06/02/2018   No Known Allergies     Medication List       Accurate as of June 02, 2018 10:46 AM. Always use your most recent med list.        amLODipine 5 MG tablet Commonly known as:  NORVASC Take 1 tablet (5 mg total) by mouth daily.   folic acid 1 MG tablet Commonly known as:  FOLVITE TAKE 1 TABLET BY MOUTH EVERY DAY   latanoprost 0.005 % ophthalmic solution Commonly known as:  XALATAN Place 1 drop into both eyes at bedtime.   Latanoprost 0.005 % Emul INT 1 GTT INTO OU HS   mesalamine 1.2 g EC tablet Commonly known as:  LIALDA Take 1.2 g by mouth 2 (two) times daily. Take 2 tablet by mouth every morning.   Xarelto 20 MG Tabs tablet Generic drug:  rivaroxaban TAKE 1 TABLET BY MOUTH DAILY WITH SUPPER       Allergies: No Known Allergies  Past Medical History, Surgical history, Social history, and Family History were reviewed and updated.  Review of  Systems: Review of Systems  Constitutional: Negative.   HENT: Negative.   Eyes: Negative.   Respiratory: Negative.   Cardiovascular: Negative.   Gastrointestinal: Negative.   Genitourinary: Negative.   Musculoskeletal: Negative.   Skin: Negative.   Neurological: Negative.   Endo/Heme/Allergies: Negative.   Psychiatric/Behavioral: Negative.      Physical Exam:  weight is 288 lb (130.6 kg). His oral temperature is 98 F (36.7 C). His blood pressure is 153/70 (abnormal) and his pulse is 55 (abnormal). His respiration is 18 and oxygen saturation is 100%.   Wt Readings from Last 3 Encounters:  06/02/18 288 lb (130.6 kg)  12/02/17 277 lb (125.6 kg)  07/09/17 283 lb (128.4 kg)    Physical Exam Vitals signs reviewed.  HENT:     Head: Normocephalic and atraumatic.  Eyes:     Pupils: Pupils are equal, round, and reactive to light.  Neck:     Musculoskeletal: Normal range of motion.  Cardiovascular:     Rate and Rhythm: Normal rate and regular rhythm.     Heart sounds: Normal heart sounds.  Pulmonary:     Effort: Pulmonary effort is normal.     Breath sounds: Normal breath sounds.  Abdominal:     General: Bowel sounds are normal.  Palpations: Abdomen is soft.  Musculoskeletal: Normal range of motion.        General: No tenderness or deformity.  Lymphadenopathy:     Cervical: No cervical adenopathy.  Skin:    General: Skin is warm and dry.     Findings: No erythema or rash.  Neurological:     Mental Status: He is alert and oriented to person, place, and time.  Psychiatric:        Behavior: Behavior normal.        Thought Content: Thought content normal.        Judgment: Judgment normal.      Lab Results  Component Value Date   WBC 6.9 06/02/2018   HGB 14.0 06/02/2018   HCT 45.3 06/02/2018   MCV 83.3 06/02/2018   PLT 160 06/02/2018   No results found for: FERRITIN, IRON, TIBC, UIBC, IRONPCTSAT Lab Results  Component Value Date   RBC 5.44 06/02/2018   No  results found for: KPAFRELGTCHN, LAMBDASER, KAPLAMBRATIO No results found for: IGGSERUM, IGA, IGMSERUM No results found for: Georgann Housekeeper, MSPIKE, SPEI   Chemistry      Component Value Date/Time   NA 134 (L) 06/02/2018 1001   NA 142 01/28/2017 1447   NA 137 09/11/2016 1104   K 3.5 06/02/2018 1001   K 3.9 01/28/2017 1447   K 4.0 09/11/2016 1104   CL 100 06/02/2018 1001   CL 103 01/28/2017 1447   CO2 27 06/02/2018 1001   CO2 29 01/28/2017 1447   CO2 25 09/11/2016 1104   BUN 10 06/02/2018 1001   BUN 8 01/28/2017 1447   BUN 12.5 09/11/2016 1104   CREATININE 0.83 06/02/2018 1001   CREATININE 0.8 01/28/2017 1447   CREATININE 0.9 09/11/2016 1104      Component Value Date/Time   CALCIUM 9.1 06/02/2018 1001   CALCIUM 8.9 01/28/2017 1447   CALCIUM 9.5 09/11/2016 1104   ALKPHOS 77 06/02/2018 1001   ALKPHOS 112 (H) 01/28/2017 1447   ALKPHOS 91 09/11/2016 1104   AST 20 06/02/2018 1001   AST 21 09/11/2016 1104   ALT 22 06/02/2018 1001   ALT 31 01/28/2017 1447   ALT 18 09/11/2016 1104   BILITOT 0.5 06/02/2018 1001   BILITOT 0.46 09/11/2016 1104      Impression and Plan: Mitchell Thornton is a very pleasant 63 yo African American gentleman with 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 He is doing well and has no complaints at this time.   We will see him back in 8 months now.  We will check his lupus anticoagulant we will see him back.  [name], MD 4/30/202010:46 AM

## 2018-06-04 LAB — DRVVT MIX: dRVVT Mix: 46 s (ref 0.0–47.0)

## 2018-06-04 LAB — LUPUS ANTICOAGULANT PANEL
DRVVT: 57.1 s — ABNORMAL HIGH (ref 0.0–47.0)
PTT Lupus Anticoagulant: 34.7 s (ref 0.0–51.9)

## 2018-06-26 ENCOUNTER — Other Ambulatory Visit: Payer: Self-pay | Admitting: Hematology & Oncology

## 2018-07-12 ENCOUNTER — Encounter: Payer: Self-pay | Admitting: Internal Medicine

## 2018-07-12 ENCOUNTER — Other Ambulatory Visit: Payer: Self-pay

## 2018-07-12 ENCOUNTER — Ambulatory Visit (INDEPENDENT_AMBULATORY_CARE_PROVIDER_SITE_OTHER): Payer: BC Managed Care – PPO | Admitting: Internal Medicine

## 2018-07-12 VITALS — BP 154/70 | HR 49 | Temp 98.4°F | Resp 16 | Ht 74.0 in | Wt 287.2 lb

## 2018-07-12 DIAGNOSIS — Z0000 Encounter for general adult medical examination without abnormal findings: Secondary | ICD-10-CM | POA: Diagnosis not present

## 2018-07-12 DIAGNOSIS — R399 Unspecified symptoms and signs involving the genitourinary system: Secondary | ICD-10-CM | POA: Diagnosis not present

## 2018-07-12 DIAGNOSIS — N390 Urinary tract infection, site not specified: Secondary | ICD-10-CM | POA: Diagnosis not present

## 2018-07-12 LAB — URINALYSIS, ROUTINE W REFLEX MICROSCOPIC
Bilirubin Urine: NEGATIVE
Hgb urine dipstick: NEGATIVE
Ketones, ur: NEGATIVE
Leukocytes,Ua: NEGATIVE
Nitrite: NEGATIVE
RBC / HPF: NONE SEEN (ref 0–?)
Specific Gravity, Urine: 1.01 (ref 1.000–1.030)
Total Protein, Urine: NEGATIVE
Urine Glucose: NEGATIVE
Urobilinogen, UA: 0.2 (ref 0.0–1.0)
WBC, UA: NONE SEEN (ref 0–?)
pH: 6 (ref 5.0–8.0)

## 2018-07-12 LAB — LIPID PANEL
Cholesterol: 159 mg/dL (ref 0–200)
HDL: 53.2 mg/dL (ref >39.00)
LDL Cholesterol: 94 mg/dL (ref 0–99)
NonHDL: 105.81
Total CHOL/HDL Ratio: 3
Triglycerides: 60 mg/dL (ref 0.0–149.0)
VLDL: 12 mg/dL (ref 0.0–40.0)

## 2018-07-12 LAB — HEMOGLOBIN A1C: Hgb A1c MFr Bld: 6.1 % (ref 4.6–6.5)

## 2018-07-12 LAB — TSH: TSH: 2.78 u[IU]/mL (ref 0.35–4.50)

## 2018-07-12 LAB — PSA: PSA: 1.56 ng/mL (ref 0.10–4.00)

## 2018-07-12 NOTE — Progress Notes (Signed)
Pre visit review using our clinic review tool, if applicable. No additional management support is needed unless otherwise documented below in the visit note. 

## 2018-07-12 NOTE — Assessment & Plan Note (Signed)
Hyperglycemia: Checking A1c HTN: BP today 154/70, on amlodipine, recommend ambulatory BPs, see AVS. Morbid obesity: Strongly encourage better diet, increase exercise, counting calories?. Other problems seem stable. RTC 1 year

## 2018-07-12 NOTE — Progress Notes (Signed)
Subjective:    Patient ID: Mitchell Thornton, male    DOB: Jul 29, 1955, 63 y.o.   MRN: [id number]  DOS:  07/12/2018 Type of visit - description: cpx In general doing well, has few concerns   Review of Systems For the last year, he has noted some urinary frequency without other LUTS.  Denies dysuria, gross hematuria, difficulty urinating. Has some nocturia up to 3 times at night.  Other than above, a 14 point review of systems is negative    Past Medical History:  Diagnosis Date  . Colitis    Cscope ~ 2007, L sided, Rx lialda  . Glaucoma     Past Surgical History:  Procedure Laterality Date  . CYSTECTOMY     from leg     Social History   Socioeconomic History  . Marital status: Married    Spouse name: Not on file  . Number of children: 3  . Years of education: Not on file  . Highest education level: Not on file  Occupational History  . 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 Financial resource strain: Not on file  . Food insecurity:    Worry: Not on file    Inability: Not on file  . Transportation needs:    Medical: Not on file    Non-medical: Not on file  Tobacco Use  . Smoking status: Never Smoker  . Smokeless tobacco: Never Used  . Tobacco comment: NEVER USED TOBACCO  Substance and Sexual Activity  . Alcohol use: No    Alcohol/week: 0.0 standard drinks  . Drug use: No  . Sexual activity: Not on file  Lifestyle  . Physical activity:    Days per week: Not on file    Minutes per session: Not on file  . Stress: Not on file  Relationships  . Social connections:    Talks on phone: Not on file    Gets together: Not on file    Attends religious service: Not on file    Active member of club or organization: Not on file    Attends meetings of clubs or organizations: Not on file    Relationship status: Not on file  . Intimate partner violence:    Fear of current or ex partner: Not on file    Emotionally abused: Not on file    Physically  abused: Not on file    Forced sexual activity: Not on file  Other Topics Concern  . Not on file  Social History Narrative   Original from [place], moved to the [place] in [place], moved to [place] ~ 1995   Household- pt, wife, 3 children           Family History  Problem Relation Age of Onset  . Diabetes Mother   . Deep vein thrombosis Mother        "clots" mother   . Hypertension Mother   . Prostate cancer Father [id number]  . Aneurysm Father        brain aneurysm  . Coronary artery disease Neg Hx   . Stroke Neg Hx   . Colon cancer Neg Hx      Allergies as of 07/12/2018   No Known Allergies     Medication List       Accurate as of July 12, 2018  5:31 PM. If you have any questions, ask your nurse or doctor.        amLODipine 5 MG tablet Commonly known as:  NORVASC  Take 1 tablet (5 mg total) by mouth daily.   folic acid 1 MG tablet Commonly known as:  FOLVITE TAKE 1 TABLET BY MOUTH EVERY DAY   latanoprost 0.005 % ophthalmic solution Commonly known as:  XALATAN Place 1 drop into both eyes at bedtime. What changed:  Another medication with the same name was removed. Continue taking this medication, and follow the directions you see here. Changed by:  Kathlene November, MD   mesalamine 1.2 g EC tablet Commonly known as:  LIALDA Take 1.2 g by mouth 2 (two) times daily. Take 2 tablet by mouth every morning.   Xarelto 20 MG Tabs tablet Generic drug:  rivaroxaban TAKE 1 TABLET BY MOUTH DAILY WITH SUPPER           Objective:   Physical Exam BP (!) 154/70 (BP Location: Left Arm, Patient Position: Sitting, Cuff Size: Normal)   Pulse (!) 49   Temp 98.4 F (36.9 C) (Oral)   Resp 16   Ht 6\' 2"  (1.88 m)   Wt 287 lb 4 oz (130.3 kg)   SpO2 100%   BMI 36.88 kg/m  General: Well developed, NAD, BMI noted Neck: No  thyromegaly  HEENT:  Normocephalic . Face symmetric, atraumatic Lungs:  CTA B Normal respiratory effort, no intercostal retractions, no accessory muscle use. Heart: RRR,   no murmur.  No pretibial edema bilaterally  Abdomen:  Not distended, soft, non-tender. No rebound or rigidity.   DRE: Quite limited due to patient size, I was barely able to reach the prostate , not tender.  No mass.  Brown stools Skin: Exposed areas without rash. Not pale. Not jaundice Neurologic:  alert & oriented X3.  Speech normal, gait appropriate for age and unassisted Strength symmetric and appropriate for age.  Psych: Cognition and judgment appear intact.  Cooperative with normal attention span and concentration.  Behavior appropriate. No anxious or depressed appearing.     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 Hemorrhoids   PLAN: Hyperglycemia: Checking A1c HTN: BP today 154/70, on amlodipine, recommend ambulatory BPs, see AVS. Morbid obesity: Strongly encourage better diet, increase exercise, counting calories?. Other problems seem stable. RTC 1 year

## 2018-07-12 NOTE — Patient Instructions (Signed)
GO TO THE LAB : Get the blood work     GO TO THE FRONT DESK Schedule your next appointment       Check the  blood pressure   weekly   GOAL is between 110/65 and  135/85. If it is consistently higher or lower, let me know  Get a flu shot yearly

## 2018-07-12 NOTE — Assessment & Plan Note (Addendum)
--   Td 07/2017, rec flu shot q year -- CCS: Cscope 9-12, reports Cscope 01-2016,   Cscope 02-2018 -- prostate ca screening DRE  today limited but normal, he has mild LUTS. Will check a UA, urine culture and PSA; consider Flomax --Diet, exercise : Discussed --labs:   FLP, A1c, TSH, UA, urine culture, PSA

## 2018-07-13 LAB — URINE CULTURE
MICRO NUMBER:: 551389
Result:: NO GROWTH
SPECIMEN QUALITY:: ADEQUATE

## 2018-09-14 DIAGNOSIS — H401132 Primary open-angle glaucoma, bilateral, moderate stage: Secondary | ICD-10-CM | POA: Diagnosis not present

## 2018-09-14 DIAGNOSIS — H04123 Dry eye syndrome of bilateral lacrimal glands: Secondary | ICD-10-CM | POA: Diagnosis not present

## 2018-09-24 ENCOUNTER — Other Ambulatory Visit: Payer: Self-pay | Admitting: Internal Medicine

## 2018-09-24 ENCOUNTER — Other Ambulatory Visit: Payer: Self-pay | Admitting: Hematology & Oncology

## 2018-12-01 IMAGING — US US EXTREM LOW VENOUS*R*
1 series · 13 of 24 positions shown · non-contrast
Comparison: None.

CLINICAL DATA: Right lower extremity pain and edema for 2-3 days.
History of left leg DVT.



[Series 1: us extrem low venous*right* · 0.09mm/px · 13 of 27 slices shown]
[im 1/27]
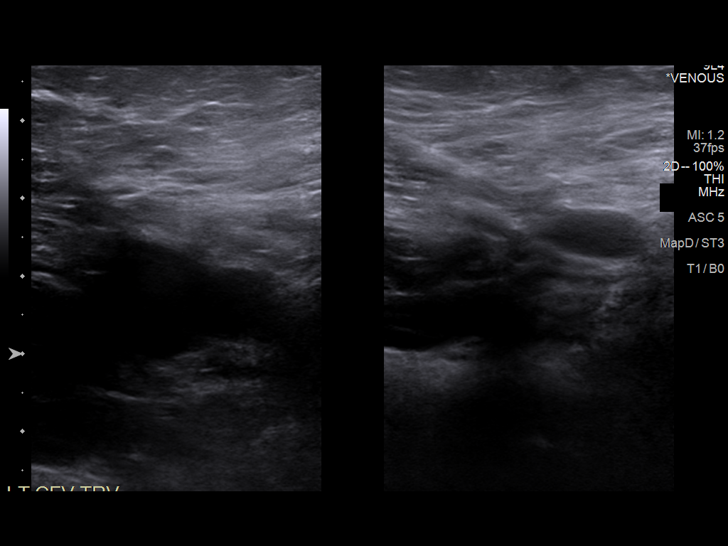
[im 3/27]
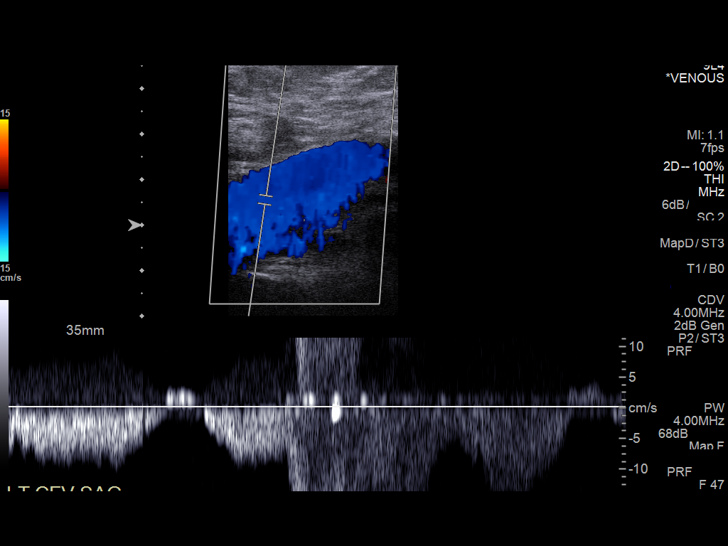
[im 5/27]
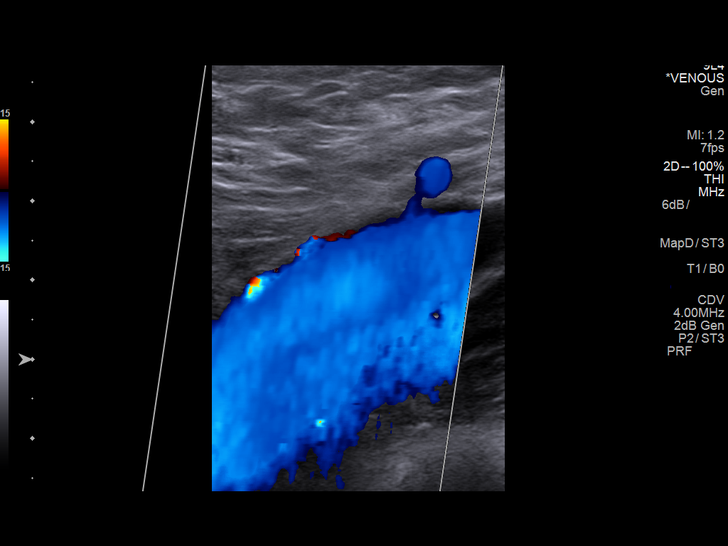
[im 7/27]
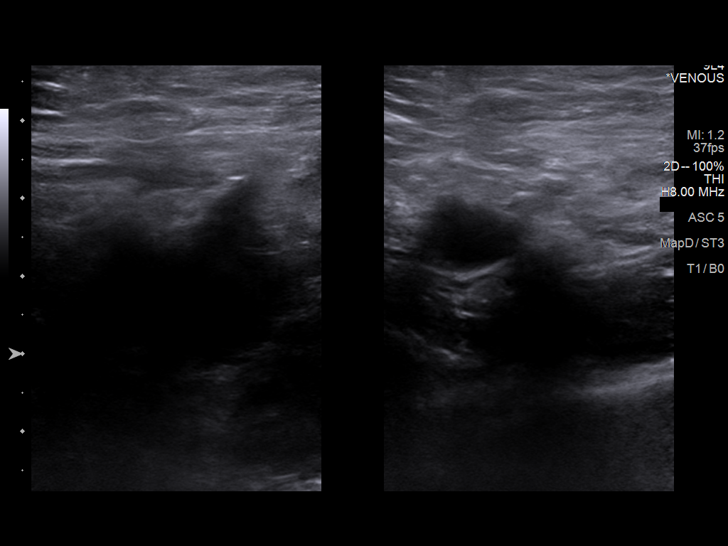
[im 10/27]
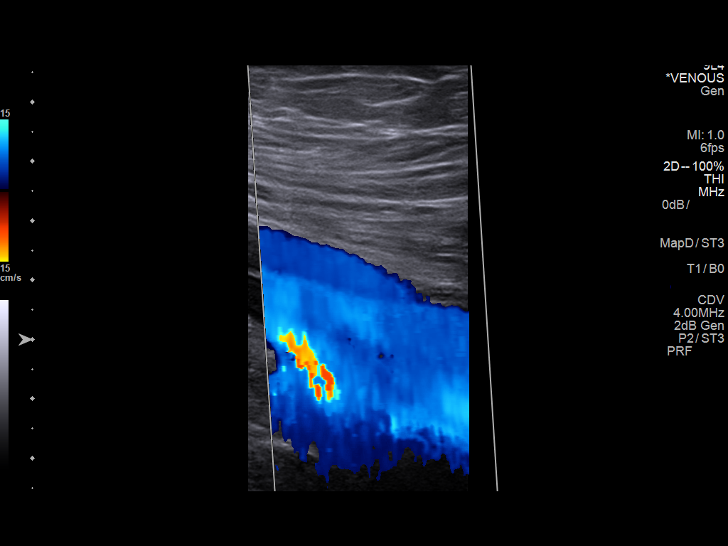
[im 12/27]
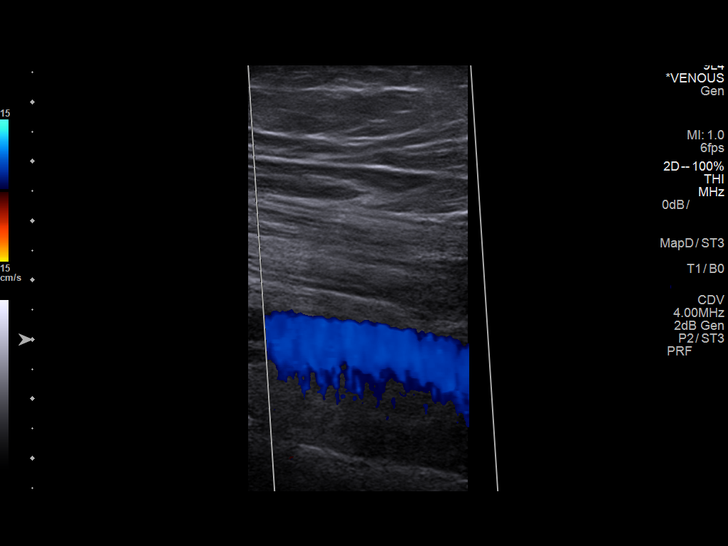
[im 14/27]
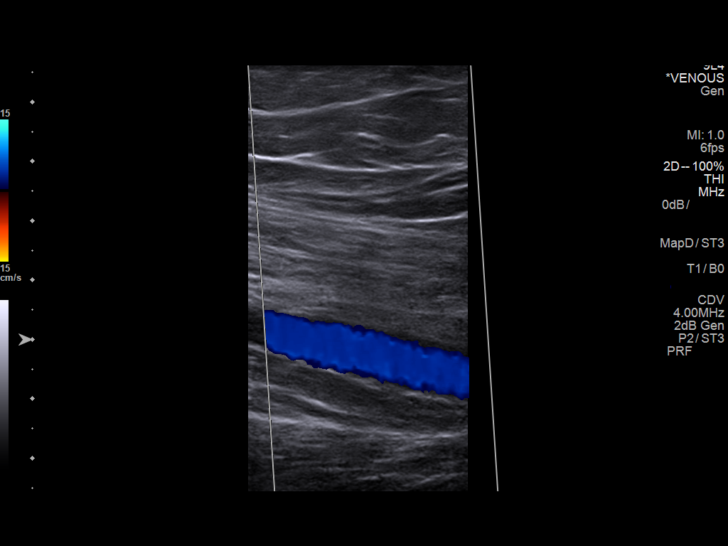
[im 15/27]
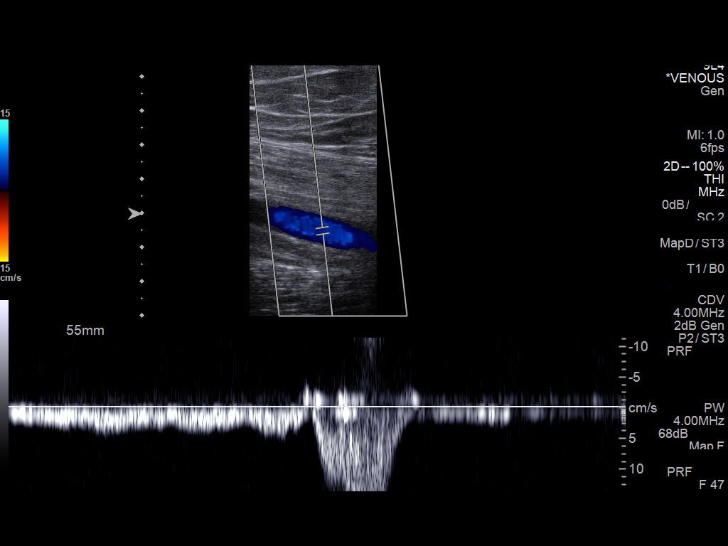
[im 17/27]
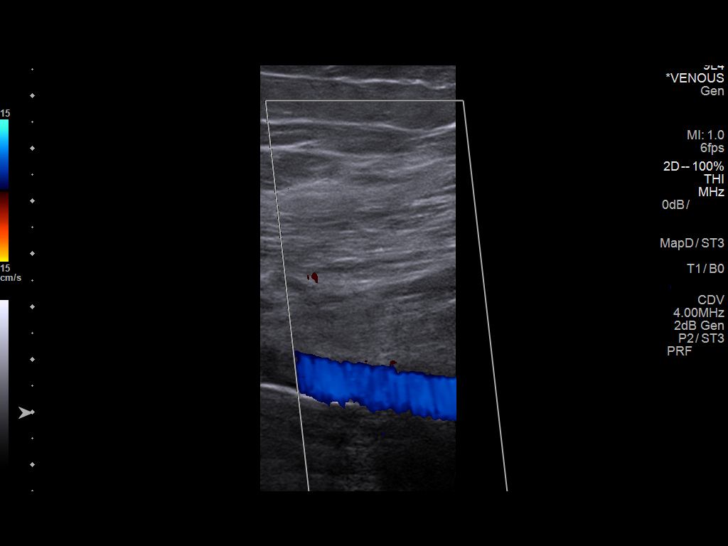
[im 20/27]
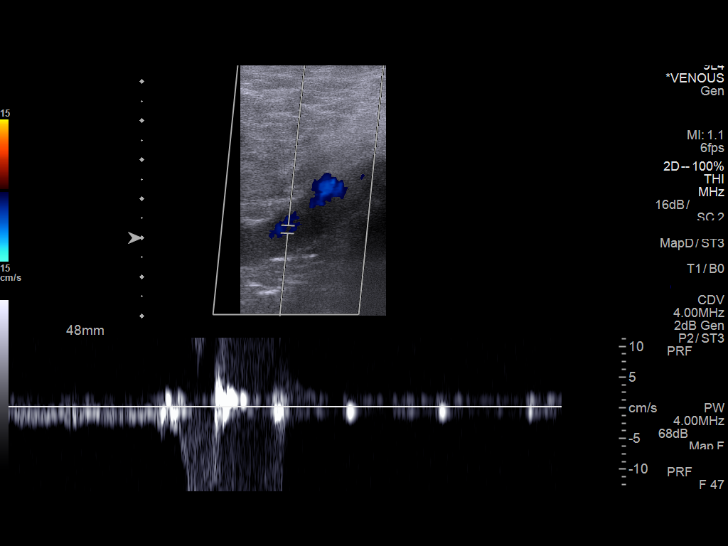
[im 22/27]
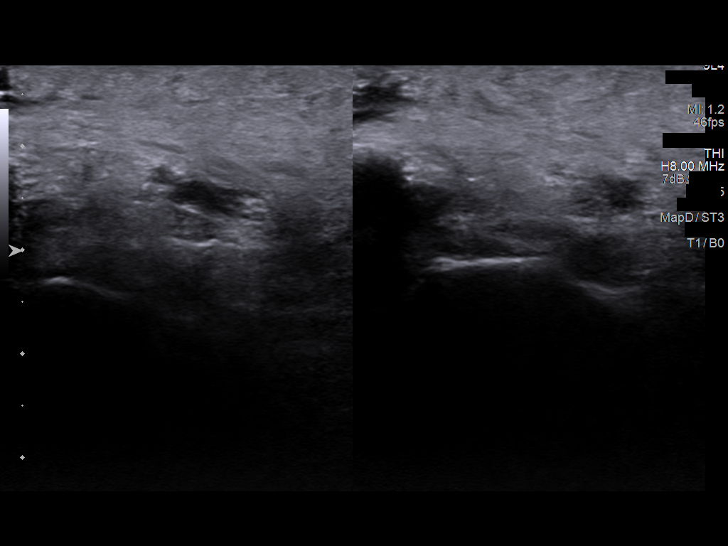
[im 24/27]
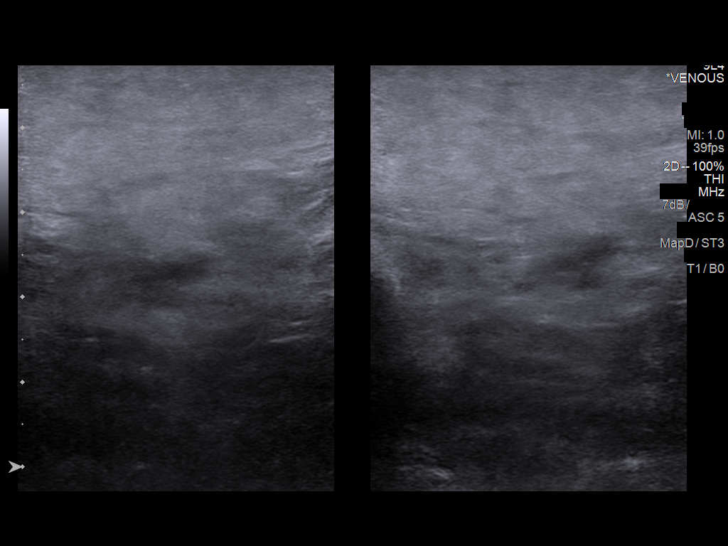
[im 27/27]
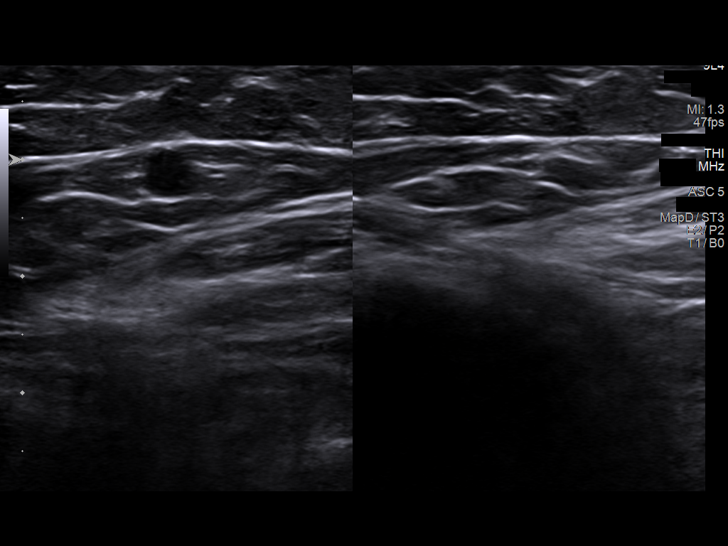

[13 of 24 positions shown; findings below may reference images not displayed]

FINDINGS: Contralateral Common Femoral Vein: Left common femoral vein is
patent without thrombus.

Common Femoral Vein: No evidence of thrombus. Normal
compressibility, respiratory phasicity and response to augmentation.

Saphenofemoral Junction: No evidence of thrombus. Normal
compressibility and flow on color Doppler imaging.

Profunda Femoral Vein: No evidence of thrombus. Normal
compressibility and flow on color Doppler imaging.

Femoral Vein: No evidence of thrombus. Normal compressibility,
respiratory phasicity and response to augmentation.

Popliteal Vein: No evidence of thrombus. Normal compressibility,
respiratory phasicity and response to augmentation.

Calf Veins: One right posterior tibial vein is occluded. There is no
vascular flow in this vein. Peroneal veins are not well visualized.

Superficial Great Saphenous Vein: Normal compressibility in the
right great saphenous vein without thrombus.

Other Findings:  None.
IMPRESSION: Positive for deep vein thrombosis in the right calf. There is
thrombus in a right posterior tibial vein.

## 2018-12-13 ENCOUNTER — Other Ambulatory Visit: Payer: Self-pay | Admitting: Hematology & Oncology

## 2018-12-14 DIAGNOSIS — H401132 Primary open-angle glaucoma, bilateral, moderate stage: Secondary | ICD-10-CM | POA: Diagnosis not present

## 2018-12-22 DIAGNOSIS — K5180 Other ulcerative colitis without complications: Secondary | ICD-10-CM | POA: Diagnosis not present

## 2018-12-23 ENCOUNTER — Other Ambulatory Visit: Payer: Self-pay | Admitting: Hematology & Oncology

## 2019-01-18 ENCOUNTER — Encounter: Payer: Self-pay | Admitting: Hematology & Oncology

## 2019-01-18 ENCOUNTER — Inpatient Hospital Stay: Payer: BC Managed Care – PPO | Attending: Hematology & Oncology

## 2019-01-18 ENCOUNTER — Inpatient Hospital Stay (HOSPITAL_BASED_OUTPATIENT_CLINIC_OR_DEPARTMENT_OTHER): Payer: BC Managed Care – PPO | Admitting: Hematology & Oncology

## 2019-01-18 ENCOUNTER — Other Ambulatory Visit: Payer: Self-pay

## 2019-01-18 VITALS — BP 162/72 | HR 56 | Temp 97.3°F | Resp 20 | Wt 302.4 lb

## 2019-01-18 DIAGNOSIS — I82409 Acute embolism and thrombosis of unspecified deep veins of unspecified lower extremity: Secondary | ICD-10-CM

## 2019-01-18 DIAGNOSIS — I82401 Acute embolism and thrombosis of unspecified deep veins of right lower extremity: Secondary | ICD-10-CM | POA: Diagnosis not present

## 2019-01-18 DIAGNOSIS — Z7901 Long term (current) use of anticoagulants: Secondary | ICD-10-CM | POA: Diagnosis not present

## 2019-01-18 DIAGNOSIS — Z86718 Personal history of other venous thrombosis and embolism: Secondary | ICD-10-CM | POA: Diagnosis not present

## 2019-01-18 DIAGNOSIS — Z79899 Other long term (current) drug therapy: Secondary | ICD-10-CM | POA: Insufficient documentation

## 2019-01-18 LAB — CMP (CANCER CENTER ONLY)
ALT: 23 U/L (ref 0–44)
AST: 25 U/L (ref 15–41)
Albumin: 4.3 g/dL (ref 3.5–5.0)
Alkaline Phosphatase: 86 U/L (ref 38–126)
Anion gap: 6 (ref 5–15)
BUN: 10 mg/dL (ref 8–23)
CO2: 27 mmol/L (ref 22–32)
Calcium: 9.1 mg/dL (ref 8.9–10.3)
Chloride: 102 mmol/L (ref 98–111)
Creatinine: 0.87 mg/dL (ref 0.61–1.24)
GFR, Est AFR Am: >60 mL/min (ref >60)
GFR, Estimated: >60 mL/min (ref >60)
Glucose, Bld: 94 mg/dL (ref 70–99)
Potassium: 4 mmol/L (ref 3.5–5.1)
Sodium: 135 mmol/L (ref 135–145)
Total Bilirubin: 0.5 mg/dL (ref 0.3–1.2)
Total Protein: 7.6 g/dL (ref 6.5–8.1)

## 2019-01-18 LAB — CBC WITH DIFFERENTIAL (CANCER CENTER ONLY)
Abs Immature Granulocytes: 0.01 10*3/uL (ref 0.00–0.07)
Basophils Absolute: 0.1 10*3/uL (ref 0.0–0.1)
Basophils Relative: 1 %
Eosinophils Absolute: 0.1 10*3/uL (ref 0.0–0.5)
Eosinophils Relative: 2 %
HCT: 44.5 % (ref 39.0–52.0)
Hemoglobin: 14.3 g/dL (ref 13.0–17.0)
Immature Granulocytes: 0 %
Lymphocytes Relative: 45 %
Lymphs Abs: 2.8 10*3/uL (ref 0.7–4.0)
MCH: 25.9 pg — ABNORMAL LOW (ref 26.0–34.0)
MCHC: 32.1 g/dL (ref 30.0–36.0)
MCV: 80.6 fL (ref 80.0–100.0)
Monocytes Absolute: 0.6 10*3/uL (ref 0.1–1.0)
Monocytes Relative: 10 %
Neutro Abs: 2.7 10*3/uL (ref 1.7–7.7)
Neutrophils Relative %: 42 %
Platelet Count: 171 10*3/uL (ref 150–400)
RBC: 5.52 MIL/uL (ref 4.22–5.81)
RDW: 14 % (ref 11.5–15.5)
WBC Count: 6.3 10*3/uL (ref 4.0–10.5)
nRBC: 0 % (ref 0.0–0.2)

## 2019-01-18 NOTE — Progress Notes (Signed)
Hematology and Oncology Follow Up Visit  Mitchell Thornton [id number] 1955-07-30 63 y.o. 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 is here today for follow-up.  He is managing so far.  He has put on weight because the gym is not open.  This is a real inconvenience for him.  He, unfortunately, will not be able to go to [place] for Christmas this year.  He and his wife typically go down there over the Christmas holiday.  He has family up in [place].  I think that they are going to have a big snow up there.  He has family up in [place].  There was a shooting on one of the subway lines.  Hopefully, this is not close to where his family lives.  He has had no problems with pain.  Has had no cough or shortness of breath.  He has had no nausea or vomiting.  He has had no change in bowel or bladder habits.  He has done well with the Xarelto.  Overall, his pormance status is ECOG 0.   Medications:  Allergies as of 01/18/2019   No Known Allergies     Medication List       Accurate as of January 18, 2019 11:15 AM. If you have any questions, ask your nurse or doctor.        amLODipine 5 MG tablet Commonly known as: NORVASC Take 1 tablet (5 mg total) by mouth daily.   folic acid 1 MG tablet Commonly known as: FOLVITE TAKE 1 TABLET BY MOUTH EVERY DAY   latanoprost 0.005 % ophthalmic solution Commonly known as: XALATAN Place 1 drop into both eyes at bedtime.   mesalamine 1.2 g EC tablet Commonly known as: LIALDA Take 1.2 g by mouth 2 (two) times daily. Take 2 tablet by mouth every morning.   Xarelto 20 MG Tabs tablet Generic drug: rivaroxaban TAKE 1 TABLET BY MOUTH DAILY WITH SUPPER       Allergies: No Known  Allergies  Past Medical History, Surgical history, Social history, and Family History were reviewed and updated.  Review of Systems: Review of Systems  Constitutional: Negative.   HENT: Negative.   Eyes: Negative.   Respiratory: Negative.   Cardiovascular: Negative.   Gastrointestinal: Negative.   Genitourinary: Negative.   Musculoskeletal: Negative.   Skin: Negative.   Neurological: Negative.   Endo/Heme/Allergies: Negative.   Psychiatric/Behavioral: Negative.      Physical Exam:  weight is 302 lb 6.4 oz (137.2 kg) (abnormal). His temporal temperature is 97.3 F (36.3 C) (abnormal). His blood pressure is 162/72 (abnormal) and his pulse is 56 (abnormal). His respiration is 20 and oxygen saturation is 100%.   Wt Readings from Last 3 Encounters:  01/18/19 (!) 302 lb 6.4 oz (137.2 kg)  07/12/18 287 lb 4 oz (130.3 kg)  06/02/18 288 lb (130.6 kg)    Physical Exam Vitals reviewed.  HENT:     Head: Normocephalic and atraumatic.  Eyes:     Pupils: Pupils are equal, round, and reactive to light.  Cardiovascular:     Rate and Rhythm: Normal rate and regular rhythm.     Heart sounds: Normal heart sounds.  Pulmonary:     Effort: Pulmonary effort is normal.  Breath sounds: Normal breath sounds.  Abdominal:     General: Bowel sounds are normal.     Palpations: Abdomen is soft.  Musculoskeletal:        General: No tenderness or deformity. Normal range of motion.     Cervical back: Normal range of motion.  Lymphadenopathy:     Cervical: No cervical adenopathy.  Skin:    General: Skin is warm and dry.     Findings: No erythema or rash.  Neurological:     Mental Status: He is alert and oriented to person, place, and time.  Psychiatric:        Behavior: Behavior normal.        Thought Content: Thought content normal.        Judgment: Judgment normal.      Lab Results  Component Value Date   WBC 6.3 01/18/2019   HGB 14.3 01/18/2019   HCT 44.5 01/18/2019   MCV 80.6  01/18/2019   PLT 171 01/18/2019   No results found for: FERRITIN, IRON, TIBC, UIBC, IRONPCTSAT Lab Results  Component Value Date   RBC 5.52 01/18/2019   No results found for: KPAFRELGTCHN, LAMBDASER, KAPLAMBRATIO No results found for: Loel Lofty, IGMSERUM No results found for: Marda Stalker, SPEI   Chemistry      Component Value Date/Time   NA 135 01/18/2019 1008   NA 142 01/28/2017 1447   NA 137 09/11/2016 1104   K 4.0 01/18/2019 1008   K 3.9 01/28/2017 1447   K 4.0 09/11/2016 1104   CL 102 01/18/2019 1008   CL 103 01/28/2017 1447   CO2 27 01/18/2019 1008   CO2 29 01/28/2017 1447   CO2 25 09/11/2016 1104   BUN 10 01/18/2019 1008   BUN 8 01/28/2017 1447   BUN 12.5 09/11/2016 1104   CREATININE 0.87 01/18/2019 1008   CREATININE 0.8 01/28/2017 1447   CREATININE 0.9 09/11/2016 1104      Component Value Date/Time   CALCIUM 9.1 01/18/2019 1008   CALCIUM 8.9 01/28/2017 1447   CALCIUM 9.5 09/11/2016 1104   ALKPHOS 86 01/18/2019 1008   ALKPHOS 112 (H) 01/28/2017 1447   ALKPHOS 91 09/11/2016 1104   AST 25 01/18/2019 1008   AST 21 09/11/2016 1104   ALT 23 01/18/2019 1008   ALT 31 01/28/2017 1447   ALT 18 09/11/2016 1104   BILITOT 0.5 01/18/2019 1008   BILITOT 0.46 09/11/2016 1104      Impression and Plan: 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 He is doing well and has no complaints at this time.   We will see him back in 6 months now.    [name], MD 12/16/202011:15 AM

## 2019-01-20 LAB — DRVVT CONFIRM: dRVVT Confirm: 1.2 ratio (ref 0.8–1.2)

## 2019-01-20 LAB — LUPUS ANTICOAGULANT PANEL
DRVVT: 48.8 s — ABNORMAL HIGH (ref 0.0–47.0)
PTT Lupus Anticoagulant: 32.9 s (ref 0.0–51.9)

## 2019-01-20 LAB — DRVVT MIX: dRVVT Mix: 42.3 s — ABNORMAL HIGH (ref 0.0–40.4)

## 2019-02-01 ENCOUNTER — Other Ambulatory Visit: Payer: BLUE CROSS/BLUE SHIELD

## 2019-02-01 ENCOUNTER — Ambulatory Visit: Payer: BLUE CROSS/BLUE SHIELD | Admitting: Hematology & Oncology

## 2019-03-14 ENCOUNTER — Other Ambulatory Visit: Payer: Self-pay | Admitting: Hematology & Oncology

## 2019-03-24 ENCOUNTER — Other Ambulatory Visit: Payer: Self-pay | Admitting: Hematology & Oncology

## 2019-04-12 DIAGNOSIS — H04123 Dry eye syndrome of bilateral lacrimal glands: Secondary | ICD-10-CM | POA: Diagnosis not present

## 2019-04-12 DIAGNOSIS — H401132 Primary open-angle glaucoma, bilateral, moderate stage: Secondary | ICD-10-CM | POA: Diagnosis not present

## 2019-05-06 DIAGNOSIS — K5180 Other ulcerative colitis without complications: Secondary | ICD-10-CM | POA: Diagnosis not present

## 2019-05-06 DIAGNOSIS — D649 Anemia, unspecified: Secondary | ICD-10-CM | POA: Diagnosis not present

## 2019-06-23 ENCOUNTER — Other Ambulatory Visit: Payer: Self-pay | Admitting: Hematology & Oncology

## 2019-07-11 DIAGNOSIS — K641 Second degree hemorrhoids: Secondary | ICD-10-CM | POA: Diagnosis not present

## 2019-07-11 DIAGNOSIS — K2950 Unspecified chronic gastritis without bleeding: Secondary | ICD-10-CM | POA: Diagnosis not present

## 2019-07-11 DIAGNOSIS — K529 Noninfective gastroenteritis and colitis, unspecified: Secondary | ICD-10-CM | POA: Diagnosis not present

## 2019-07-11 DIAGNOSIS — D509 Iron deficiency anemia, unspecified: Secondary | ICD-10-CM | POA: Diagnosis not present

## 2019-07-11 DIAGNOSIS — Z8719 Personal history of other diseases of the digestive system: Secondary | ICD-10-CM | POA: Diagnosis not present

## 2019-07-11 DIAGNOSIS — K5180 Other ulcerative colitis without complications: Secondary | ICD-10-CM | POA: Diagnosis not present

## 2019-07-11 DIAGNOSIS — K639 Disease of intestine, unspecified: Secondary | ICD-10-CM | POA: Diagnosis not present

## 2019-07-11 DIAGNOSIS — E611 Iron deficiency: Secondary | ICD-10-CM | POA: Diagnosis not present

## 2019-07-11 DIAGNOSIS — K5190 Ulcerative colitis, unspecified, without complications: Secondary | ICD-10-CM | POA: Diagnosis not present

## 2019-07-11 DIAGNOSIS — Z86010 Personal history of colonic polyps: Secondary | ICD-10-CM | POA: Diagnosis not present

## 2019-07-11 DIAGNOSIS — K5730 Diverticulosis of large intestine without perforation or abscess without bleeding: Secondary | ICD-10-CM | POA: Diagnosis not present

## 2019-07-11 LAB — HM COLONOSCOPY

## 2019-07-13 ENCOUNTER — Encounter: Payer: Self-pay | Admitting: Internal Medicine

## 2019-07-13 ENCOUNTER — Ambulatory Visit (INDEPENDENT_AMBULATORY_CARE_PROVIDER_SITE_OTHER): Payer: BC Managed Care – PPO | Admitting: Internal Medicine

## 2019-07-13 ENCOUNTER — Other Ambulatory Visit: Payer: Self-pay

## 2019-07-13 VITALS — BP 148/64 | HR 43 | Temp 97.3°F | Resp 18 | Ht 74.0 in | Wt 293.5 lb

## 2019-07-13 DIAGNOSIS — R739 Hyperglycemia, unspecified: Secondary | ICD-10-CM | POA: Diagnosis not present

## 2019-07-13 DIAGNOSIS — Z0000 Encounter for general adult medical examination without abnormal findings: Secondary | ICD-10-CM | POA: Diagnosis not present

## 2019-07-13 LAB — CBC WITH DIFFERENTIAL/PLATELET
Basophils Absolute: 0 10*3/uL (ref 0.0–0.1)
Basophils Relative: 0.5 % (ref 0.0–3.0)
Eosinophils Absolute: 0.2 10*3/uL (ref 0.0–0.7)
Eosinophils Relative: 2.8 % (ref 0.0–5.0)
HCT: 43.6 % (ref 39.0–52.0)
Hemoglobin: 14.4 g/dL (ref 13.0–17.0)
Lymphocytes Relative: 35 % (ref 12.0–46.0)
Lymphs Abs: 2.1 10*3/uL (ref 0.7–4.0)
MCHC: 33 g/dL (ref 30.0–36.0)
MCV: 78.9 fl (ref 78.0–100.0)
Monocytes Absolute: 0.5 10*3/uL (ref 0.1–1.0)
Monocytes Relative: 9.1 % (ref 3.0–12.0)
Neutro Abs: 3.1 10*3/uL (ref 1.4–7.7)
Neutrophils Relative %: 52.6 % (ref 43.0–77.0)
Platelets: 151 10*3/uL (ref 150.0–400.0)
RBC: 5.52 Mil/uL (ref 4.22–5.81)
RDW: 16.3 % — ABNORMAL HIGH (ref 11.5–15.5)
WBC: 5.9 10*3/uL (ref 4.0–10.5)

## 2019-07-13 LAB — LIPID PANEL
Cholesterol: 149 mg/dL (ref 0–200)
HDL: 45.8 mg/dL (ref >39.00)
LDL Cholesterol: 92 mg/dL (ref 0–99)
NonHDL: 102.8
Total CHOL/HDL Ratio: 3
Triglycerides: 53 mg/dL (ref 0.0–149.0)
VLDL: 10.6 mg/dL (ref 0.0–40.0)

## 2019-07-13 LAB — PSA: PSA: 2.2 ng/mL (ref 0.10–4.00)

## 2019-07-13 MED ORDER — AMLODIPINE BESYLATE 10 MG PO TABS: 10.0000 mg | ORAL_TABLET | Freq: Every day | ORAL | 2 refills | Status: DC

## 2019-07-13 NOTE — Progress Notes (Addendum)
Subjective:    Patient ID: Mitchell Thornton, male    DOB: 01-Sep-1955, 64 y.o.   MRN: [id number]  DOS:  07/13/2019 Type of visit - description:  Here for CPX Since the last office visit he is doing well.  Saw GI and hematology, notes and labs reviewed  BP Readings from Last 3 Encounters:  07/13/19 (!) 148/64  01/18/19 (!) 162/72  07/12/18 (!) 154/70     Review of Systems In general feeling well. Was recommended to drink plenty of fluids which increase his urinary frequency but denies any LUTS. Also denies any abdominal symptoms including nausea, vomiting.  No diarrhea or blood in the stools. No GERD or epigastric pain.  Other than above, a 14 point review of systems is negative     Past Medical History:  Diagnosis Date   Colitis    Cscope ~ 2007, L sided, Rx lialda   Glaucoma     Past Surgical History:  Procedure Laterality Date   CYSTECTOMY     from leg    Family History  Problem Relation Age of Onset   Diabetes Mother    Deep vein thrombosis Mother        "clots" mother    Hypertension Mother    Prostate cancer Father 1   Aneurysm Father        brain aneurysm   Coronary artery disease Neg Hx    Stroke Neg Hx    Colon cancer Neg Hx     Allergies as of 07/13/2019   No Known Allergies     Medication List       Accurate as of July 13, 2019 11:59 PM. If you have any questions, ask your nurse or doctor.        amLODipine 10 MG tablet Commonly known as: NORVASC Take 1 tablet (10 mg total) by mouth daily. What changed:   medication strength  how much to take Changed by: Kathlene November, MD   ferrous sulfate 324 MG Tbec Take 324 mg by mouth.   folic acid 1 MG tablet Commonly known as: FOLVITE TAKE 1 TABLET BY MOUTH EVERY DAY   latanoprost 0.005 % ophthalmic solution Commonly known as: XALATAN Place 1 drop into both eyes at bedtime.   mesalamine 1.2 g EC tablet Commonly known as: LIALDA Take 1.2 g by mouth 2 (two) times daily. Take 2  tablet by mouth every morning.   Xarelto 20 MG Tabs tablet Generic drug: rivaroxaban TAKE 1 TABLET BY MOUTH DAILY WITH SUPPER          Objective:   Physical Exam BP (!) 148/64 (BP Location: Left Arm, Patient Position: Sitting, Cuff Size: Normal)    Pulse (!) 43    Temp (!) 97.3 F (36.3 C) (Temporal)    Resp 18    Ht 6\' 2"  (1.88 m)    Wt 293 lb 8 oz (133.1 kg)    SpO2 100%    BMI 37.68 kg/m  General: Well developed, NAD, BMI noted Neck: No  thyromegaly  HEENT:  Normocephalic . Face symmetric, atraumatic Lungs:  CTA B Normal respiratory effort, no intercostal retractions, no accessory muscle use. Heart: RRR,  no murmur.  Abdomen:  Not distended, soft, non-tender. No rebound or rigidity.   Lower extremities: no pretibial edema bilaterally  Skin: Exposed areas without rash. Not pale. Not jaundice Neurologic:  alert & oriented X3.  Speech normal, gait appropriate for age and unassisted Strength symmetric and appropriate for age.  Psych: Cognition  and judgment appear intact.  Cooperative with normal attention span and concentration.  Behavior appropriate. No anxious or depressed appearing.     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 Hemorrhoids   PLAN: Here for CPX Hyperglycemia: Check A1c Morbid obesity: Diet exercise discussed HTN: Well controlled?  Last 3 available readings elevated, we agreed to watch diet closely, increase amlodipine to 10 mg, watch for edema.  Monitor ambulatory BPs.  See AVS. History of DVT: Saw hematology 01/2019, felt to be  doing well, on Xarelto Ulcerative colitis: Saw GI 12-2018, was found to have iron deficiency, had a colonoscopy recently.  Currently on iron, causing constipation, recommend to reach out to GI. RTC 1 year      This visit occurred during the SARS-CoV-2 public health emergency.  Safety protocols were in place, including screening questions prior to the visit, additional usage of staff PPE, and extensive cleaning of exam room while observing appropriate contact time as indicated for disinfecting solutions.

## 2019-07-13 NOTE — Patient Instructions (Addendum)
Please call us with you COVID vaccine dates.   Increase amlodipine to 10 mg every day, we are sending a new prescription.  Start checking your blood pressures weekly BP GOAL is between 110/65 and  135/85. If it is consistently higher or lower, let me know   GO TO THE LAB : Get the blood work     GO TO THE FRONT DESK, PLEASE SCHEDULE YOUR APPOINTMENTS Come back for a physical exam in 1 year

## 2019-07-13 NOTE — Progress Notes (Signed)
Pre visit review using our clinic review tool, if applicable. No additional management support is needed unless otherwise documented below in the visit note. 

## 2019-07-14 LAB — HEMOGLOBIN A1C: Hgb A1c MFr Bld: 6 % (ref 4.6–6.5)

## 2019-07-15 NOTE — Assessment & Plan Note (Signed)
Here for CPX Hyperglycemia: Check A1c Morbid obesity: Diet exercise discussed HTN: Well controlled?  Last 3 available readings elevated, we agreed to watch diet closely, increase amlodipine to 10 mg, watch for edema.  Monitor ambulatory BPs.  See AVS. History of DVT: Saw hematology 01/2019, felt to be doing well, on Xarelto Ulcerative colitis: Saw GI 12-2018, was found to have iron deficiency, had a colonoscopy recently.  Currently on iron, causing constipation, recommend to reach out to GI. RTC 1 year

## 2019-07-15 NOTE — Assessment & Plan Note (Signed)
--   Td 07/2017 -Had a Covid vaccination -Rec flu shot q year -- CCS: Cscope 9-12, reports Cscope 01-2016,   Cscope 02-2018, Cscope 07/11/2019, next per GI -- prostate ca screening: Last DRE and PSA 07/2018.  + FH, request a PSA, will do.  He is asymptomatic. --Diet has improved, recommend daily walk. --labs:   FLP, CBC, A1c, PSA

## 2019-07-19 ENCOUNTER — Encounter: Payer: Self-pay | Admitting: Hematology & Oncology

## 2019-07-19 ENCOUNTER — Inpatient Hospital Stay (HOSPITAL_BASED_OUTPATIENT_CLINIC_OR_DEPARTMENT_OTHER): Payer: BC Managed Care – PPO | Admitting: Hematology & Oncology

## 2019-07-19 ENCOUNTER — Inpatient Hospital Stay: Payer: BC Managed Care – PPO | Attending: Hematology & Oncology

## 2019-07-19 ENCOUNTER — Telehealth: Payer: Self-pay | Admitting: Hematology & Oncology

## 2019-07-19 ENCOUNTER — Other Ambulatory Visit: Payer: Self-pay

## 2019-07-19 VITALS — BP 143/65 | HR 53 | Temp 98.1°F | Resp 18 | Wt 296.5 lb

## 2019-07-19 DIAGNOSIS — I82401 Acute embolism and thrombosis of unspecified deep veins of right lower extremity: Secondary | ICD-10-CM

## 2019-07-19 DIAGNOSIS — Z86718 Personal history of other venous thrombosis and embolism: Secondary | ICD-10-CM | POA: Diagnosis not present

## 2019-07-19 DIAGNOSIS — Z7901 Long term (current) use of anticoagulants: Secondary | ICD-10-CM | POA: Insufficient documentation

## 2019-07-19 LAB — CMP (CANCER CENTER ONLY)
ALT: 21 U/L (ref 0–44)
AST: 21 U/L (ref 15–41)
Albumin: 4 g/dL (ref 3.5–5.0)
Alkaline Phosphatase: 78 U/L (ref 38–126)
Anion gap: 6 (ref 5–15)
BUN: 10 mg/dL (ref 8–23)
CO2: 29 mmol/L (ref 22–32)
Calcium: 9.6 mg/dL (ref 8.9–10.3)
Chloride: 102 mmol/L (ref 98–111)
Creatinine: 0.92 mg/dL (ref 0.61–1.24)
GFR, Est AFR Am: >60 mL/min (ref >60)
GFR, Estimated: >60 mL/min (ref >60)
Glucose, Bld: 81 mg/dL (ref 70–99)
Potassium: 4 mmol/L (ref 3.5–5.1)
Sodium: 137 mmol/L (ref 135–145)
Total Bilirubin: 0.5 mg/dL (ref 0.3–1.2)
Total Protein: 7.4 g/dL (ref 6.5–8.1)

## 2019-07-19 LAB — CBC WITH DIFFERENTIAL (CANCER CENTER ONLY)
Abs Immature Granulocytes: 0.01 10*3/uL (ref 0.00–0.07)
Basophils Absolute: 0 10*3/uL (ref 0.0–0.1)
Basophils Relative: 1 %
Eosinophils Absolute: 0.2 10*3/uL (ref 0.0–0.5)
Eosinophils Relative: 3 %
HCT: 44.5 % (ref 39.0–52.0)
Hemoglobin: 14.2 g/dL (ref 13.0–17.0)
Immature Granulocytes: 0 %
Lymphocytes Relative: 39 %
Lymphs Abs: 2.5 10*3/uL (ref 0.7–4.0)
MCH: 25.5 pg — ABNORMAL LOW (ref 26.0–34.0)
MCHC: 31.9 g/dL (ref 30.0–36.0)
MCV: 79.9 fL — ABNORMAL LOW (ref 80.0–100.0)
Monocytes Absolute: 0.6 10*3/uL (ref 0.1–1.0)
Monocytes Relative: 10 %
Neutro Abs: 3.1 10*3/uL (ref 1.7–7.7)
Neutrophils Relative %: 47 %
Platelet Count: 187 10*3/uL (ref 150–400)
RBC: 5.57 MIL/uL (ref 4.22–5.81)
RDW: 15.3 % (ref 11.5–15.5)
WBC Count: 6.4 10*3/uL (ref 4.0–10.5)
nRBC: 0 % (ref 0.0–0.2)

## 2019-07-19 NOTE — Telephone Encounter (Signed)
Appointments scheduled calendar printed per 6/16 los 

## 2019-07-19 NOTE — Progress Notes (Signed)
Hematology and Oncology Follow Up Visit  [name] [id number] May 28, 1955 64 y.o. 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 is here today for follow-up.  He is actually doing pretty well.  Unfortunately, he cannot go to his home country of [place] over the winter.  He likes to go down around Christmas time.  Because of the coronavirus, he cannot go down.  Maybe, he will be able to go down this Christmas.  He is doing well with the Xarelto.  He has had no problems with Xarelto.  He has had no bleeding.  There is been no change in bowel or bladder habits.  He is had no leg swelling.    When we last saw him back in December, there was no lupus anticoagulant noted in his blood work.  He has had no issues with fever.  He has had no cough or shortness of breath.  There is been no chest wall pain.  His weight is down a little bit.  He is trying to lose some weight.  Overall, his pormance status is ECOG 0.   Medications:  Allergies as of 07/19/2019   No Known Allergies     Medication List       Accurate as of July 19, 2019 10:34 AM. If you have any questions, ask your nurse or doctor.        amLODipine 10 MG tablet Commonly known as: NORVASC Take 1 tablet (10 mg total) by mouth daily.   ferrous sulfate 324 MG Tbec Take 324 mg by mouth.   folic acid 1 MG tablet Commonly known as: FOLVITE TAKE 1 TABLET BY MOUTH EVERY DAY   latanoprost 0.005 % ophthalmic solution Commonly known as: XALATAN Place 1 drop into both eyes at bedtime.   mesalamine 1.2 g EC tablet Commonly known as: LIALDA Take 1.2 g by mouth 2 (two) times daily. Take 2 tablet by mouth every morning.   Xarelto 20 MG Tabs tablet Generic drug: rivaroxaban TAKE 1  TABLET BY MOUTH DAILY WITH SUPPER       Allergies: No Known Allergies  Past Medical History, Surgical history, Social history, and Family History were reviewed and updated.  Review of Systems: Review of Systems  Constitutional: Negative.   HENT: Negative.   Eyes: Negative.   Respiratory: Negative.   Cardiovascular: Negative.   Gastrointestinal: Negative.   Genitourinary: Negative.   Musculoskeletal: Negative.   Skin: Negative.   Neurological: Negative.   Endo/Heme/Allergies: Negative.   Psychiatric/Behavioral: Negative.      Physical Exam:  weight is 296 lb 8 oz (134.5 kg). His oral temperature is 98.1 F (36.7 C). His blood pressure is 143/65 (abnormal) and his pulse is 53 (abnormal). His respiration is 18 and oxygen saturation is 100%.   Wt Readings from Last 3 Encounters:  07/19/19 296 lb 8 oz (134.5 kg)  07/13/19 293 lb 8 oz (133.1 kg)  01/18/19 (!) 302 lb 6.4 oz (137.2 kg)    Physical Exam Vitals reviewed.  HENT:     Head: Normocephalic and atraumatic.  Eyes:     Pupils: Pupils are equal, round, and reactive to light.  Cardiovascular:     Rate and Rhythm: Normal rate and regular rhythm.     Heart sounds: Normal  heart sounds.  Pulmonary:     Effort: Pulmonary effort is normal.     Breath sounds: Normal breath sounds.  Abdominal:     General: Bowel sounds are normal.     Palpations: Abdomen is soft.  Musculoskeletal:        General: No tenderness or deformity. Normal range of motion.     Cervical back: Normal range of motion.  Lymphadenopathy:     Cervical: No cervical adenopathy.  Skin:    General: Skin is warm and dry.     Findings: No erythema or rash.  Neurological:     Mental Status: He is alert and oriented to person, place, and time.  Psychiatric:        Behavior: Behavior normal.        Thought Content: Thought content normal.        Judgment: Judgment normal.      Lab Results  Component Value Date   WBC 6.4 07/19/2019   HGB 14.2  07/19/2019   HCT 44.5 07/19/2019   MCV 79.9 (L) 07/19/2019   PLT 187 07/19/2019   No results found for: FERRITIN, IRON, TIBC, UIBC, IRONPCTSAT Lab Results  Component Value Date   RBC 5.57 07/19/2019   No results found for: KPAFRELGTCHN, LAMBDASER, KAPLAMBRATIO No results found for: IGGSERUM, IGA, IGMSERUM No results found for: Odetta Pink, SPEI   Chemistry      Component Value Date/Time   NA 137 07/19/2019 0940   NA 142 01/28/2017 1447   NA 137 09/11/2016 1104   K 4.0 07/19/2019 0940   K 3.9 01/28/2017 1447   K 4.0 09/11/2016 1104   CL 102 07/19/2019 0940   CL 103 01/28/2017 1447   CO2 29 07/19/2019 0940   CO2 29 01/28/2017 1447   CO2 25 09/11/2016 1104   BUN 10 07/19/2019 0940   BUN 8 01/28/2017 1447   BUN 12.5 09/11/2016 1104   CREATININE 0.92 07/19/2019 0940   CREATININE 0.8 01/28/2017 1447   CREATININE 0.9 09/11/2016 1104      Component Value Date/Time   CALCIUM 9.6 07/19/2019 0940   CALCIUM 8.9 01/28/2017 1447   CALCIUM 9.5 09/11/2016 1104   ALKPHOS 78 07/19/2019 0940   ALKPHOS 112 (H) 01/28/2017 1447   ALKPHOS 91 09/11/2016 1104   AST 21 07/19/2019 0940   AST 21 09/11/2016 1104   ALT 21 07/19/2019 0940   ALT 31 01/28/2017 1447   ALT 18 09/11/2016 1104   BILITOT 0.5 07/19/2019 0940   BILITOT 0.46 09/11/2016 1104      Impression and Plan: [name] is a very pleasant 64 yo African American gentleman with 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  Again, the lupus anticoagulant we last checked him was negative.  He will stay on his Xarelto.    At this point, I think we by getting back yearly.  He knows that he can always give [place] a call if he has any problems between now and when we see him back.      Volanda Napoleon, MD 6/16/202110:34 AM

## 2019-07-30 DIAGNOSIS — D649 Anemia, unspecified: Secondary | ICD-10-CM | POA: Diagnosis not present

## 2019-08-16 ENCOUNTER — Other Ambulatory Visit: Payer: Self-pay | Admitting: Hematology & Oncology

## 2019-09-13 DIAGNOSIS — H04123 Dry eye syndrome of bilateral lacrimal glands: Secondary | ICD-10-CM | POA: Diagnosis not present

## 2019-09-13 DIAGNOSIS — H401132 Primary open-angle glaucoma, bilateral, moderate stage: Secondary | ICD-10-CM | POA: Diagnosis not present

## 2019-09-20 ENCOUNTER — Other Ambulatory Visit: Payer: Self-pay | Admitting: Hematology & Oncology

## 2019-09-20 ENCOUNTER — Other Ambulatory Visit: Payer: Self-pay | Admitting: Internal Medicine

## 2019-12-21 ENCOUNTER — Other Ambulatory Visit: Payer: Self-pay | Admitting: Hematology & Oncology

## 2020-01-07 DIAGNOSIS — Z20822 Contact with and (suspected) exposure to covid-19: Secondary | ICD-10-CM | POA: Diagnosis not present

## 2020-01-13 DIAGNOSIS — J019 Acute sinusitis, unspecified: Secondary | ICD-10-CM | POA: Diagnosis not present

## 2020-01-18 ENCOUNTER — Ambulatory Visit: Payer: BC Managed Care – PPO | Admitting: Hematology & Oncology

## 2020-01-18 ENCOUNTER — Other Ambulatory Visit: Payer: BC Managed Care – PPO

## 2020-01-24 DIAGNOSIS — H401132 Primary open-angle glaucoma, bilateral, moderate stage: Secondary | ICD-10-CM | POA: Diagnosis not present

## 2020-01-24 DIAGNOSIS — H04123 Dry eye syndrome of bilateral lacrimal glands: Secondary | ICD-10-CM | POA: Diagnosis not present

## 2020-02-28 DIAGNOSIS — M25562 Pain in left knee: Secondary | ICD-10-CM | POA: Diagnosis not present

## 2020-03-15 DIAGNOSIS — K5130 Ulcerative (chronic) rectosigmoiditis without complications: Secondary | ICD-10-CM | POA: Diagnosis not present

## 2020-04-24 DIAGNOSIS — H43813 Vitreous degeneration, bilateral: Secondary | ICD-10-CM | POA: Diagnosis not present

## 2020-04-24 DIAGNOSIS — H401132 Primary open-angle glaucoma, bilateral, moderate stage: Secondary | ICD-10-CM | POA: Diagnosis not present

## 2020-04-24 DIAGNOSIS — H2513 Age-related nuclear cataract, bilateral: Secondary | ICD-10-CM | POA: Diagnosis not present

## 2020-04-24 DIAGNOSIS — H04123 Dry eye syndrome of bilateral lacrimal glands: Secondary | ICD-10-CM | POA: Diagnosis not present

## 2020-05-15 ENCOUNTER — Other Ambulatory Visit: Payer: Self-pay | Admitting: Internal Medicine

## 2020-05-16 ENCOUNTER — Encounter: Payer: Self-pay | Admitting: Internal Medicine

## 2020-06-10 ENCOUNTER — Other Ambulatory Visit: Payer: Self-pay | Admitting: Hematology & Oncology

## 2020-06-12 ENCOUNTER — Other Ambulatory Visit: Payer: Self-pay | Admitting: Hematology & Oncology

## 2020-07-16 ENCOUNTER — Telehealth: Payer: Self-pay

## 2020-07-16 ENCOUNTER — Other Ambulatory Visit: Payer: Self-pay

## 2020-07-16 ENCOUNTER — Ambulatory Visit (INDEPENDENT_AMBULATORY_CARE_PROVIDER_SITE_OTHER): Payer: BC Managed Care – PPO | Admitting: Internal Medicine

## 2020-07-16 ENCOUNTER — Encounter: Payer: Self-pay | Admitting: Internal Medicine

## 2020-07-16 VITALS — BP 138/74 | HR 53 | Temp 98.1°F | Resp 16 | Ht 72.0 in | Wt 299.4 lb

## 2020-07-16 DIAGNOSIS — I10 Essential (primary) hypertension: Secondary | ICD-10-CM

## 2020-07-16 DIAGNOSIS — R739 Hyperglycemia, unspecified: Secondary | ICD-10-CM | POA: Diagnosis not present

## 2020-07-16 DIAGNOSIS — Z0000 Encounter for general adult medical examination without abnormal findings: Secondary | ICD-10-CM

## 2020-07-16 DIAGNOSIS — R399 Unspecified symptoms and signs involving the genitourinary system: Secondary | ICD-10-CM | POA: Diagnosis not present

## 2020-07-16 LAB — COMPREHENSIVE METABOLIC PANEL
ALT: 21 U/L (ref 0–53)
AST: 21 U/L (ref 0–37)
Albumin: 4.2 g/dL (ref 3.5–5.2)
Alkaline Phosphatase: 69 U/L (ref 39–117)
BUN: 8 mg/dL (ref 6–23)
CO2: 29 mEq/L (ref 19–32)
Calcium: 9 mg/dL (ref 8.4–10.5)
Chloride: 101 mEq/L (ref 96–112)
Creatinine, Ser: 0.86 mg/dL (ref 0.40–1.50)
GFR: 92.35 mL/min (ref >60.00)
Glucose, Bld: 91 mg/dL (ref 70–99)
Potassium: 4 mEq/L (ref 3.5–5.1)
Sodium: 136 mEq/L (ref 135–145)
Total Bilirubin: 0.6 mg/dL (ref 0.2–1.2)
Total Protein: 7.2 g/dL (ref 6.0–8.3)

## 2020-07-16 LAB — CBC WITH DIFFERENTIAL/PLATELET
Basophils Absolute: 0 10*3/uL (ref 0.0–0.1)
Basophils Relative: 1 % (ref 0.0–3.0)
Eosinophils Absolute: 0.1 10*3/uL (ref 0.0–0.7)
Eosinophils Relative: 2.9 % (ref 0.0–5.0)
HCT: 43.5 % (ref 39.0–52.0)
Hemoglobin: 14.3 g/dL (ref 13.0–17.0)
Lymphocytes Relative: 44.7 % (ref 12.0–46.0)
Lymphs Abs: 2.1 10*3/uL (ref 0.7–4.0)
MCHC: 32.8 g/dL (ref 30.0–36.0)
MCV: 80.7 fl (ref 78.0–100.0)
Monocytes Absolute: 0.5 10*3/uL (ref 0.1–1.0)
Monocytes Relative: 10.4 % (ref 3.0–12.0)
Neutro Abs: 1.9 10*3/uL (ref 1.4–7.7)
Neutrophils Relative %: 41 % — ABNORMAL LOW (ref 43.0–77.0)
Platelets: 162 10*3/uL (ref 150.0–400.0)
RBC: 5.39 Mil/uL (ref 4.22–5.81)
RDW: 14.5 % (ref 11.5–15.5)
WBC: 4.7 10*3/uL (ref 4.0–10.5)

## 2020-07-16 LAB — URINALYSIS, ROUTINE W REFLEX MICROSCOPIC
Bilirubin Urine: NEGATIVE
Hgb urine dipstick: NEGATIVE
Ketones, ur: NEGATIVE
Leukocytes,Ua: NEGATIVE
Nitrite: NEGATIVE
RBC / HPF: NONE SEEN (ref 0–?)
Specific Gravity, Urine: <=1.005 — AB (ref 1.000–1.030)
Total Protein, Urine: NEGATIVE
Urine Glucose: NEGATIVE
Urobilinogen, UA: 0.2 (ref 0.0–1.0)
WBC, UA: NONE SEEN (ref 0–?)
pH: 6.5 (ref 5.0–8.0)

## 2020-07-16 LAB — PSA: PSA: 2.41 ng/mL (ref 0.10–4.00)

## 2020-07-16 LAB — LIPID PANEL
Cholesterol: 151 mg/dL (ref 0–200)
HDL: 50.2 mg/dL (ref >39.00)
LDL Cholesterol: 90 mg/dL (ref 0–99)
NonHDL: 100.77
Total CHOL/HDL Ratio: 3
Triglycerides: 54 mg/dL (ref 0.0–149.0)
VLDL: 10.8 mg/dL (ref 0.0–40.0)

## 2020-07-16 LAB — HEMOGLOBIN A1C: Hgb A1c MFr Bld: 6 % (ref 4.6–6.5)

## 2020-07-16 NOTE — Assessment & Plan Note (Signed)
--   Td 07/2017 -Covid vax x 3 , plans to take #4 this year - shingrix: Discussed and recommended -- CCS: Cscope 9-12, reports Cscope 01-2016,   Cscope 02-2018, Cscope 07/11/2019, next per GI -- prostate ca screening:  + FH, DRE normal, check a PSA.   Has mild nocturia without other symptoms, recommend to avoid late water drinking, check UA UCX. --Diet and exercise discussed --labs:   CMP, FLP,CBC, A1c, PSA. -- POA d/w pt

## 2020-07-16 NOTE — Patient Instructions (Signed)
Check the  blood pressure  BP GOAL is between 110/65 and  135/85. If it is consistently higher or lower, let me know  Consider vaccination against shingles   GO TO THE LAB : Get the blood work     GO TO THE FRONT DESK, PLEASE SCHEDULE YOUR APPOINTMENTS Come back for a physical exam in 1 year   "Living will", "Health Care Power of attorney": Advanced care planning  (If you already have a living will or healthcare power of attorney, please bring the copy to be scanned in your chart.)  Advance care planning is a process that supports adults in  understanding and sharing their preferences regarding future medical care.   The patient's preferences are recorded in documents called Advance Directives.    Advanced directives are completed (and can be modified at any time) while the patient is in full mental capacity.   The documentation should be available at all times to the patient, the family and the healthcare providers.  Bring in a copy to be scanned in your chart is an excellent idea and is recommended   This legal documents direct treatment decision making and/or appoint a surrogate to make the decision if the patient is not capable to do so.    Advance directives can be documented in many types of formats,  documents have names such as:  Lliving will  Durable power of attorney for healthcare (healthcare proxy or healthcare power of attorney)  Combined directives  Physician orders for life-sustaining treatment    More information at:  [email or web address]

## 2020-07-16 NOTE — Assessment & Plan Note (Signed)
Here for CPX Hyperglycemia: Check A1c HTN: BP very good today, continue amlodipine, recommend to check ambulatory BPs Morbid obesity: Diet and exercise discussed, including decrease carbohydrate intake, portion control. UC: Seems controlled, sees GI Lifelong anticoagulation: Seems to be tolerating well. RTC 1 year

## 2020-07-16 NOTE — Progress Notes (Signed)
Subjective:    Patient ID: Mitchell Thornton, male    DOB: 1955/07/26, 65 y.o.   MRN: [id number]  DOS:  07/16/2020 Type of visit - description: CPX  Since the last office visit is doing well.  Has no major concerns. From time to time has nocturia, not associated with dysuria, blood in the stools or difficulty urinating.  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   Tobacco Use   Smoking status: Never   Smokeless tobacco: Never   Tobacco comments:    NEVER USED TOBACCO  Vaping Use   Vaping Use: Never used  Substance and Sexual Activity   Alcohol use: No    Alcohol/week: 0.0 standard drinks   Drug use: No   Sexual activity: Not on file  Other Topics Concern   Not on file  Social History Narrative   Original from [place], moved to the [place] in 1982, moved to [place] ~ 1995   Household- pt, wife, 1 daughter   Social Determinants of Corporate investment banker Strain: Not on file  Food Insecurity: Not on file  Transportation Needs: Not on file  Physical Activity: Not on file  Stress: Not on file  Social Connections: Not on file  Intimate Partner Violence: Not on file    Allergies as of 07/16/2020   No Known Allergies      Medication List        Accurate as of July 16, 2020  5:59 PM. If you have any questions, ask your nurse or doctor.          amLODipine 10 MG tablet Commonly known as: NORVASC Take 1 tablet (10 mg total) by mouth daily.   ferrous sulfate 324 MG Tbec Take 324 mg  by mouth.   folic acid 1 MG tablet Commonly known as: FOLVITE TAKE 1 TABLET BY MOUTH EVERY DAY   latanoprost 0.005 % ophthalmic solution Commonly known as: XALATAN Place 1 drop into both eyes at bedtime.   mesalamine 1.2 g EC tablet Commonly known as: LIALDA Take 1.2 g by mouth 2 (two) times daily. Take 2 tablet by mouth every morning.   Xarelto 20 MG Tabs tablet Generic drug: rivaroxaban TAKE 1 TABLET BY MOUTH DAILY WITH SUPPER           Objective:   Physical Exam BP 138/74 (BP Location: Left Arm, Patient Position: Sitting, Cuff Size: Normal)   Pulse (!) 53   Temp 98.1 F (36.7 C) (Oral)   Resp 16   Ht 6' (1.829 m)   Wt 299 lb 6 oz (135.8 kg)   SpO2 98%   BMI 40.60 kg/m  General: Well developed, NAD, BMI noted Neck: No  thyromegaly  HEENT:  Normocephalic . Face symmetric, atraumatic Lungs:  CTA B Normal respiratory effort, no intercostal retractions, no accessory muscle use. Heart: RRR,  no murmur.  Abdomen:  Not distended, soft, non-tender. No rebound or rigidity.   Lower extremities: no pretibial edema bilaterally DRE: Normal sphincter tone, no stools, exam very limited but prostate seems normal Skin: Exposed areas without rash. Not pale. Not jaundice Neurologic:  alert & oriented X3.  Speech normal, gait appropriate for age and unassisted Strength symmetric and appropriate for age.  Psych: Cognition and judgment appear intact.  Cooperative with normal attention span and concentration.  Behavior appropriate. No anxious or depressed appearing.     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 Hemorrhoids   PLAN: Here for CPX Hyperglycemia: Check A1c HTN: BP very good today, continue amlodipine, recommend to check ambulatory BPs Morbid obesity: Diet and exercise discussed, including decrease carbohydrate intake, portion control. UC: Seems controlled, sees GI Lifelong anticoagulation: Seems to be tolerating well. RTC 1 year   This visit occurred during the SARS-CoV-2 public health emergency.  Safety protocols were in place, including screening questions prior to the visit, additional usage of staff PPE, and extensive cleaning of exam room while observing appropriate contact time as indicated for disinfecting solutions.

## 2020-07-17 LAB — URINE CULTURE
MICRO NUMBER:: 12005239
Result:: NO GROWTH
SPECIMEN QUALITY:: ADEQUATE

## 2020-07-18 ENCOUNTER — Ambulatory Visit: Payer: BC Managed Care – PPO | Admitting: Hematology & Oncology

## 2020-07-18 ENCOUNTER — Other Ambulatory Visit: Payer: BC Managed Care – PPO

## 2020-08-13 ENCOUNTER — Inpatient Hospital Stay: Payer: BC Managed Care – PPO | Attending: Hematology & Oncology

## 2020-08-13 ENCOUNTER — Other Ambulatory Visit: Payer: Self-pay

## 2020-08-13 ENCOUNTER — Other Ambulatory Visit: Payer: Self-pay | Admitting: *Deleted

## 2020-08-13 ENCOUNTER — Inpatient Hospital Stay: Payer: BC Managed Care – PPO | Admitting: Hematology & Oncology

## 2020-08-13 ENCOUNTER — Encounter: Payer: Self-pay | Admitting: Hematology & Oncology

## 2020-08-13 VITALS — BP 133/70 | HR 52 | Temp 99.0°F | Resp 18 | Wt 300.0 lb

## 2020-08-13 DIAGNOSIS — I82401 Acute embolism and thrombosis of unspecified deep veins of right lower extremity: Secondary | ICD-10-CM | POA: Diagnosis not present

## 2020-08-13 DIAGNOSIS — Z86718 Personal history of other venous thrombosis and embolism: Secondary | ICD-10-CM | POA: Insufficient documentation

## 2020-08-13 DIAGNOSIS — Z7901 Long term (current) use of anticoagulants: Secondary | ICD-10-CM | POA: Diagnosis not present

## 2020-08-13 LAB — COMPREHENSIVE METABOLIC PANEL
ALT: 19 U/L (ref 0–44)
AST: 16 U/L (ref 15–41)
Albumin: 4.3 g/dL (ref 3.5–5.0)
Alkaline Phosphatase: 76 U/L (ref 38–126)
Anion gap: 7 (ref 5–15)
BUN: 11 mg/dL (ref 8–23)
CO2: 27 mmol/L (ref 22–32)
Calcium: 9.6 mg/dL (ref 8.9–10.3)
Chloride: 99 mmol/L (ref 98–111)
Creatinine, Ser: 0.89 mg/dL (ref 0.61–1.24)
GFR, Estimated: >60 mL/min (ref >60)
Glucose, Bld: 88 mg/dL (ref 70–99)
Potassium: 3.9 mmol/L (ref 3.5–5.1)
Sodium: 133 mmol/L — ABNORMAL LOW (ref 135–145)
Total Bilirubin: 0.5 mg/dL (ref 0.3–1.2)
Total Protein: 7.1 g/dL (ref 6.5–8.1)

## 2020-08-13 LAB — CBC WITH DIFFERENTIAL (CANCER CENTER ONLY)
Abs Immature Granulocytes: 0.03 10*3/uL (ref 0.00–0.07)
Basophils Absolute: 0.1 10*3/uL (ref 0.0–0.1)
Basophils Relative: 1 %
Eosinophils Absolute: 0.2 10*3/uL (ref 0.0–0.5)
Eosinophils Relative: 3 %
HCT: 42.7 % (ref 39.0–52.0)
Hemoglobin: 13.7 g/dL (ref 13.0–17.0)
Immature Granulocytes: 0 %
Lymphocytes Relative: 37 %
Lymphs Abs: 2.7 10*3/uL (ref 0.7–4.0)
MCH: 26.5 pg (ref 26.0–34.0)
MCHC: 32.1 g/dL (ref 30.0–36.0)
MCV: 82.6 fL (ref 80.0–100.0)
Monocytes Absolute: 0.7 10*3/uL (ref 0.1–1.0)
Monocytes Relative: 10 %
Neutro Abs: 3.7 10*3/uL (ref 1.7–7.7)
Neutrophils Relative %: 49 %
Platelet Count: 172 10*3/uL (ref 150–400)
RBC: 5.17 MIL/uL (ref 4.22–5.81)
RDW: 14.2 % (ref 11.5–15.5)
WBC Count: 7.3 10*3/uL (ref 4.0–10.5)
nRBC: 0 % (ref 0.0–0.2)

## 2020-08-13 NOTE — Progress Notes (Signed)
Hematology and Oncology Follow Up Visit  Mitchell Thornton [id number] Dec 30, 1955 65 y.o. 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  He is doing okay.  He actually got stung by yellow jackets in the right lower leg recently.  He was mowing the yard and hit a yellow jacket nest.  Several long jackets got him.  Thankfully, he did not have any kind of allergic reaction.  Otherwise is doing okay.  We saw him a year ago.  He is on Xarelto.  He is doing well with the Xarelto.  He has had no bleeding with Xarelto.    Last time we checked his lupus anticoagulant, this was not present.  He did have a colonoscopy back in 2021.  Everything looked okay.  There are no obvious polyps.  He has had no issues with cough.  There has been no coronavirus exposures.  Overall, his performance status is ECOG 1.  Medications:  Allergies as of 08/13/2020   No Known Allergies      Medication List        Accurate as of August 13, 2020  3:34 PM. If you have any questions, ask your nurse or doctor.          amLODipine 10 MG tablet Commonly known as: NORVASC Take 1 tablet (10 mg total) by mouth daily.   ferrous sulfate 324 MG Tbec Take 324 mg by mouth.   folic acid 1 MG tablet Commonly known as: FOLVITE TAKE 1 TABLET BY MOUTH EVERY DAY   latanoprost 0.005 % ophthalmic solution Commonly known as: XALATAN Place 1 drop into both eyes at bedtime.   mesalamine 1.2 g EC tablet Commonly known as: LIALDA Take 1.2 g by mouth 2 (two) times daily. Take 2 tablet by mouth every morning.   Xarelto 20 MG Tabs tablet Generic drug: rivaroxaban TAKE 1 TABLET BY MOUTH DAILY WITH SUPPER        Allergies: No Known Allergies  Past  Medical History, Surgical history, Social history, and Family History were reviewed and updated.  Review of Systems: Review of Systems  Constitutional: Negative.   HENT: Negative.    Eyes: Negative.   Respiratory: Negative.    Cardiovascular: Negative.   Gastrointestinal: Negative.   Genitourinary: Negative.   Musculoskeletal: Negative.   Skin: Negative.   Neurological: Negative.   Endo/Heme/Allergies: Negative.   Psychiatric/Behavioral: Negative.      Physical Exam:  weight is 300 lb (136.1 kg). His oral temperature is 99 F (37.2 C). His blood pressure is 133/70 and his pulse is 52 (abnormal). His respiration is 18 and oxygen saturation is 99%.   Wt Readings from Last 3 Encounters:  08/13/20 300 lb (136.1 kg)  07/16/20 299 lb 6 oz (135.8 kg)  07/19/19 296 lb 8 oz (134.5 kg)    Physical Exam Vitals reviewed.  HENT:     Head: Normocephalic and atraumatic.  Eyes:     Pupils: Pupils are equal, round, and reactive to light.  Cardiovascular:     Rate and Rhythm: Normal rate and regular rhythm.     Heart sounds: Normal heart sounds.  Pulmonary:     Effort: Pulmonary effort is normal.     Breath sounds: Normal breath sounds.  Abdominal:     General: Bowel sounds are normal.     Palpations: Abdomen is soft.  Musculoskeletal:        General: No tenderness or deformity. Normal range of motion.     Cervical back: Normal range of motion.  Lymphadenopathy:     Cervical: No cervical adenopathy.  Skin:    General: Skin is warm and dry.     Findings: No erythema or rash.  Neurological:     Mental Status: He is alert and oriented to person, place, and time.  Psychiatric:        Behavior: Behavior normal.        Thought Content: Thought content normal.        Judgment: Judgment normal.     Lab Results  Component Value Date   WBC 7.3 08/13/2020   HGB 13.7 08/13/2020   HCT 42.7 08/13/2020   MCV 82.6 08/13/2020   PLT 172 08/13/2020   No results found for: FERRITIN,  IRON, TIBC, UIBC, IRONPCTSAT Lab Results  Component Value Date   RBC 5.17 08/13/2020   No results found for: KPAFRELGTCHN, LAMBDASER, KAPLAMBRATIO No results found for: IGGSERUM, IGA, IGMSERUM No results found for: Georgann Housekeeper, MSPIKE, SPEI   Chemistry      Component Value Date/Time   NA 133 (L) 08/13/2020 1447   NA 142 01/28/2017 1447   NA 137 09/11/2016 1104   K 3.9 08/13/2020 1447   K 3.9 01/28/2017 1447   K 4.0 09/11/2016 1104   CL 99 08/13/2020 1447   CL 103 01/28/2017 1447   CO2 27 08/13/2020 1447   CO2 29 01/28/2017 1447   CO2 25 09/11/2016 1104   BUN 11 08/13/2020 1447   BUN 8 01/28/2017 1447   BUN 12.5 09/11/2016 1104   CREATININE 0.89 08/13/2020 1447   CREATININE 0.92 07/19/2019 0940   CREATININE 0.8 01/28/2017 1447   CREATININE 0.9 09/11/2016 1104      Component Value Date/Time   CALCIUM 9.6 08/13/2020 1447   CALCIUM 8.9 01/28/2017 1447   CALCIUM 9.5 09/11/2016 1104   ALKPHOS 76 08/13/2020 1447   ALKPHOS 112 (H) 01/28/2017 1447   ALKPHOS 91 09/11/2016 1104   AST 16 08/13/2020 1447   AST 21 07/19/2019 0940   AST 21 09/11/2016 1104   ALT 19 08/13/2020 1447   ALT 21 07/19/2019 0940   ALT 31 01/28/2017 1447   ALT 18 09/11/2016 1104   BILITOT 0.5 08/13/2020 1447   BILITOT 0.5 07/19/2019 0940   BILITOT 0.46 09/11/2016 1104      Impression and Plan: Mitchell Thornton is a very pleasant 65 yo African American gentleman with 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  Again, the lupus anticoagulant we last checked him was negative.  He will stay on his Xarelto.    At this point, I think we by getting back yearly.  He knows that he can always give [place] a call if he has any problems between now and when we see him back.      Mitchell [name], MD 7/12/20223:34 PM

## 2020-09-16 ENCOUNTER — Other Ambulatory Visit: Payer: Self-pay | Admitting: Hematology & Oncology

## 2020-10-09 DIAGNOSIS — H401132 Primary open-angle glaucoma, bilateral, moderate stage: Secondary | ICD-10-CM | POA: Diagnosis not present

## 2020-10-09 DIAGNOSIS — H04123 Dry eye syndrome of bilateral lacrimal glands: Secondary | ICD-10-CM | POA: Diagnosis not present

## 2020-10-09 DIAGNOSIS — H43813 Vitreous degeneration, bilateral: Secondary | ICD-10-CM | POA: Diagnosis not present

## 2020-10-09 DIAGNOSIS — H2513 Age-related nuclear cataract, bilateral: Secondary | ICD-10-CM | POA: Diagnosis not present

## 2020-11-08 ENCOUNTER — Other Ambulatory Visit: Payer: Self-pay | Admitting: Internal Medicine

## 2020-12-12 ENCOUNTER — Other Ambulatory Visit: Payer: Self-pay | Admitting: Hematology & Oncology

## 2021-01-21 ENCOUNTER — Other Ambulatory Visit: Payer: Self-pay | Admitting: Hematology & Oncology

## 2021-02-26 DIAGNOSIS — H43813 Vitreous degeneration, bilateral: Secondary | ICD-10-CM | POA: Diagnosis not present

## 2021-02-26 DIAGNOSIS — H2513 Age-related nuclear cataract, bilateral: Secondary | ICD-10-CM | POA: Diagnosis not present

## 2021-02-26 DIAGNOSIS — H401132 Primary open-angle glaucoma, bilateral, moderate stage: Secondary | ICD-10-CM | POA: Diagnosis not present

## 2021-02-26 DIAGNOSIS — H04123 Dry eye syndrome of bilateral lacrimal glands: Secondary | ICD-10-CM | POA: Diagnosis not present

## 2021-03-11 ENCOUNTER — Other Ambulatory Visit: Payer: Self-pay | Admitting: Hematology & Oncology

## 2021-05-06 DIAGNOSIS — K5130 Ulcerative (chronic) rectosigmoiditis without complications: Secondary | ICD-10-CM | POA: Diagnosis not present

## 2021-06-12 ENCOUNTER — Other Ambulatory Visit: Payer: Self-pay | Admitting: Hematology & Oncology

## 2021-06-26 DIAGNOSIS — H2513 Age-related nuclear cataract, bilateral: Secondary | ICD-10-CM | POA: Diagnosis not present

## 2021-06-26 DIAGNOSIS — H43813 Vitreous degeneration, bilateral: Secondary | ICD-10-CM | POA: Diagnosis not present

## 2021-06-26 DIAGNOSIS — H04123 Dry eye syndrome of bilateral lacrimal glands: Secondary | ICD-10-CM | POA: Diagnosis not present

## 2021-06-26 DIAGNOSIS — H401132 Primary open-angle glaucoma, bilateral, moderate stage: Secondary | ICD-10-CM | POA: Diagnosis not present

## 2021-07-17 ENCOUNTER — Encounter: Payer: BC Managed Care – PPO | Admitting: Internal Medicine

## 2021-08-04 ENCOUNTER — Other Ambulatory Visit: Payer: Self-pay | Admitting: Internal Medicine

## 2021-08-12 ENCOUNTER — Encounter: Payer: Self-pay | Admitting: Internal Medicine

## 2021-08-12 DIAGNOSIS — K5190 Ulcerative colitis, unspecified, without complications: Secondary | ICD-10-CM | POA: Diagnosis not present

## 2021-08-12 DIAGNOSIS — K5289 Other specified noninfective gastroenteritis and colitis: Secondary | ICD-10-CM | POA: Diagnosis not present

## 2021-08-12 DIAGNOSIS — K529 Noninfective gastroenteritis and colitis, unspecified: Secondary | ICD-10-CM | POA: Diagnosis not present

## 2021-08-12 DIAGNOSIS — K5730 Diverticulosis of large intestine without perforation or abscess without bleeding: Secondary | ICD-10-CM | POA: Diagnosis not present

## 2021-08-12 DIAGNOSIS — K5130 Ulcerative (chronic) rectosigmoiditis without complications: Secondary | ICD-10-CM | POA: Diagnosis not present

## 2021-08-12 DIAGNOSIS — K640 First degree hemorrhoids: Secondary | ICD-10-CM | POA: Diagnosis not present

## 2021-08-12 LAB — HM COLONOSCOPY

## 2021-08-14 ENCOUNTER — Ambulatory Visit (INDEPENDENT_AMBULATORY_CARE_PROVIDER_SITE_OTHER): Payer: BC Managed Care – PPO | Admitting: Internal Medicine

## 2021-08-14 ENCOUNTER — Encounter: Payer: Self-pay | Admitting: Internal Medicine

## 2021-08-14 VITALS — BP 132/66 | HR 52 | Temp 98.2°F | Resp 18 | Ht 72.0 in | Wt 299.4 lb

## 2021-08-14 DIAGNOSIS — Z23 Encounter for immunization: Secondary | ICD-10-CM

## 2021-08-14 DIAGNOSIS — R739 Hyperglycemia, unspecified: Secondary | ICD-10-CM | POA: Diagnosis not present

## 2021-08-14 DIAGNOSIS — Z0000 Encounter for general adult medical examination without abnormal findings: Secondary | ICD-10-CM

## 2021-08-14 DIAGNOSIS — K51919 Ulcerative colitis, unspecified with unspecified complications: Secondary | ICD-10-CM | POA: Diagnosis not present

## 2021-08-14 DIAGNOSIS — I10 Essential (primary) hypertension: Secondary | ICD-10-CM | POA: Diagnosis not present

## 2021-08-14 DIAGNOSIS — R399 Unspecified symptoms and signs involving the genitourinary system: Secondary | ICD-10-CM | POA: Diagnosis not present

## 2021-08-14 DIAGNOSIS — Z0001 Encounter for general adult medical examination with abnormal findings: Secondary | ICD-10-CM

## 2021-08-14 DIAGNOSIS — R0683 Snoring: Secondary | ICD-10-CM

## 2021-08-14 LAB — COMPREHENSIVE METABOLIC PANEL
ALT: 20 U/L (ref 0–53)
AST: 19 U/L (ref 0–37)
Albumin: 4.1 g/dL (ref 3.5–5.2)
Alkaline Phosphatase: 78 U/L (ref 39–117)
BUN: 11 mg/dL (ref 6–23)
CO2: 26 mEq/L (ref 19–32)
Calcium: 8.8 mg/dL (ref 8.4–10.5)
Chloride: 102 mEq/L (ref 96–112)
Creatinine, Ser: 0.87 mg/dL (ref 0.40–1.50)
GFR: 91.33 mL/min (ref >60.00)
Glucose, Bld: 84 mg/dL (ref 70–99)
Potassium: 4 mEq/L (ref 3.5–5.1)
Sodium: 135 mEq/L (ref 135–145)
Total Bilirubin: 0.6 mg/dL (ref 0.2–1.2)
Total Protein: 7.3 g/dL (ref 6.0–8.3)

## 2021-08-14 LAB — URINALYSIS, ROUTINE W REFLEX MICROSCOPIC
Bilirubin Urine: NEGATIVE
Hgb urine dipstick: NEGATIVE
Ketones, ur: NEGATIVE
Leukocytes,Ua: NEGATIVE
Nitrite: NEGATIVE
RBC / HPF: NONE SEEN (ref 0–?)
Specific Gravity, Urine: 1.01 (ref 1.000–1.030)
Total Protein, Urine: NEGATIVE
Urine Glucose: NEGATIVE
Urobilinogen, UA: 0.2 (ref 0.0–1.0)
pH: 7 (ref 5.0–8.0)

## 2021-08-14 LAB — LIPID PANEL
Cholesterol: 140 mg/dL (ref 0–200)
HDL: 45.1 mg/dL (ref >39.00)
LDL Cholesterol: 82 mg/dL (ref 0–99)
NonHDL: 95.17
Total CHOL/HDL Ratio: 3
Triglycerides: 66 mg/dL (ref 0.0–149.0)
VLDL: 13.2 mg/dL (ref 0.0–40.0)

## 2021-08-14 LAB — TSH: TSH: 2.1 u[IU]/mL (ref 0.35–5.50)

## 2021-08-14 LAB — PSA: PSA: 3.58 ng/mL (ref 0.10–4.00)

## 2021-08-14 LAB — HEMOGLOBIN A1C: Hgb A1c MFr Bld: 6.1 % (ref 4.6–6.5)

## 2021-08-14 NOTE — Assessment & Plan Note (Signed)
Here for CPX Hyperglycemia: Checking A1c. HTN: BP is very good.  Continue amlodipine, checking labs Morbid obesity: Diet and exercise discussed. Lifelong anticoagulation: To see hematology soon; ROS neg for bleeding Ulcerative colitis, Saw GI 05/06/2021, was felt to be stable, had a colonoscopy this month Allergic tinnitus: See HPI, recommend consistent Flonase. LUTS: See comments under prostate cancer screening Snoring: epworth scored 6, negative, reassess periodically  RTC 1 year

## 2021-08-14 NOTE — Progress Notes (Signed)
Subjective:    Patient ID: Mitchell Thornton, male    DOB: Jan 30, 1956, 66 y.o.   MRN: [id number]  DOS:  08/14/2021 Type of visit - description: cpx  Here for CPX. In general feels well.  He is anticoagulated, denies blood in the urine or in the stools.  Reports a long history of postnasal dripping and some "tickle in the throat".  Also for the last year has been experiencing nocturia, every 2 hours. No actual dysuria, gross hematuria or difficulty urinating.  Denies lower extremity edema.  Admits to some snoring.  Wt Readings from Last 3 Encounters:  08/14/21 299 lb 6 oz (135.8 kg)  08/13/20 300 lb (136.1 kg)  07/16/20 299 lb 6 oz (135.8 kg)   BP Readings from Last 3 Encounters:  08/14/21 132/66  08/13/20 133/70  07/16/20 138/74     Review of Systems  Other than above, a 14 point review of systems is negative    Past Medical History:  Diagnosis Date   Colitis    Cscope ~ 2007, L sided, Rx lialda   Glaucoma     Past Surgical History:  Procedure Laterality Date   CYSTECTOMY     from leg    Social History   Socioeconomic History   Marital status: Married    Spouse name: Not on file   Number of children: 3   Years of education: Not on file   Highest education level: Not on file  Occupational History   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   Tobacco Use   Smoking status: Never   Smokeless tobacco: Never   Tobacco comments:    NEVER USED TOBACCO  Vaping Use   Vaping Use: Never used  Substance and Sexual Activity   Alcohol use: No    Alcohol/week: 0.0 standard drinks of alcohol   Drug use: No   Sexual activity: Not on file  Other Topics Concern   Not on file  Social History Narrative   Original from [place], moved to the [place] in 1982, moved to [place] ~ 1995   Household- pt, wife    Social Determinants of Corporate investment banker Strain: Not on Ship broker Insecurity: Not on file  Transportation Needs: Not on file  Physical Activity: Not on file   Stress: Not on file  Social Connections: Not on file  Intimate Partner Violence: Not on file    Current Outpatient Medications  Medication Instructions   amLODipine (NORVASC) 10 MG tablet TAKE 1 TABLET(10 MG) BY MOUTH DAILY   ferrous sulfate 324 mg, Oral   folic acid (FOLVITE) 1 MG tablet TAKE 1 TABLET BY MOUTH EVERY DAY   latanoprost (XALATAN) 0.005 % ophthalmic solution 1 drop, Daily at bedtime   mesalamine (LIALDA) 1.2 g, Oral, 2 times daily, Take 2 tablet by mouth every morning.   XARELTO 20 MG TABS tablet TAKE 1 TABLET BY MOUTH DAILY WITH SUPPER       Objective:   Physical Exam BP 132/66   Pulse (!) 52   Temp 98.2 F (36.8 C) (Oral)   Resp 18   Ht 6' (1.829 m)   Wt 299 lb 6 oz (135.8 kg)   SpO2 96%   BMI 40.60 kg/m  General: Well developed, NAD, BMI noted Neck: No  thyromegaly  HEENT:  Normocephalic . Face symmetric, atraumatic Lungs:  CTA B Normal respiratory effort, no intercostal retractions, no accessory muscle use. Heart: RRR,  no murmur.  Abdomen:  Not distended, soft, non-tender.  No rebound or rigidity. DRE: Normal sphincter tone, no stools, prostate exam is limited due to patient habitus but normal. Lower extremities: no pretibial edema bilaterally  Skin: Exposed areas without rash. Not pale. Not jaundice Neurologic:  alert & oriented X3.  Speech normal, gait appropriate for age and unassisted Strength symmetric and appropriate for age.  Psych: Cognition and judgment appear intact.  Cooperative with normal attention span and concentration.  Behavior appropriate. No anxious or depressed appearing.     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 Hemorrhoids   PLAN: Here for CPX Hyperglycemia: Checking A1c. HTN: BP is very good.  Continue amlodipine, checking labs Morbid obesity: Diet and exercise discussed. Lifelong anticoagulation: To see hematology soon; ROS neg for bleeding Ulcerative colitis, Saw GI 05/06/2021, was felt to be stable, had a colonoscopy this month Allergic tinnitus: See HPI, recommend consistent Flonase. LUTS: See comments under prostate cancer screening Snoring: epworth scored 6, negative, reassess periodically  RTC 1 year

## 2021-08-14 NOTE — Assessment & Plan Note (Signed)
--   Td 07/2017 -Covid vax x: Up-to-date. - shingrix: Agreed to proceed with first shot today, next in 3 months -- CCS: Cscope 9-12, reports Cscope 01-2016,   Cscope 02-2018, Cscope 07/11/2019, C-scope 08-2021, next per GI -- prostate ca screening:  + FH, again c/o nocturia.  DRE is essentially negative.  Last year was advised to avoid excessive water before bedtime but he is not doing that.  I asked about medication and he does not think is necessary. Plan: PSA, UA urine culture, further advise w/ results if needed.  He will call if he changes his mind about medicine --Diet and exercise discussed --labs: CMP, FLP, A1c, TSH, PSA, UA, urine culture -- POA: Instructions provided.

## 2021-08-14 NOTE — Patient Instructions (Addendum)
You got your first shingles shot today.  Come back in 3 months to get your second dose.  Please make an appointment  Use Flonase over-the-counter: 2 sprays on each side of the nose every day.  Check the  blood pressure regularly BP GOAL is between 110/65 and  135/85. If it is consistently higher or lower, let me know  GO TO THE LAB : Get the blood work     GO TO THE FRONT DESK, PLEASE SCHEDULE YOUR APPOINTMENTS  Come back for a shingles shot in 3 months  Come back for a physical exam in 1 year

## 2021-08-15 ENCOUNTER — Inpatient Hospital Stay: Payer: BC Managed Care – PPO | Admitting: Family

## 2021-08-15 ENCOUNTER — Inpatient Hospital Stay: Payer: BC Managed Care – PPO

## 2021-08-15 LAB — URINE CULTURE
MICRO NUMBER:: 13642860
Result:: NO GROWTH
SPECIMEN QUALITY:: ADEQUATE

## 2021-08-25 ENCOUNTER — Other Ambulatory Visit: Payer: Self-pay

## 2021-08-25 DIAGNOSIS — Z7901 Long term (current) use of anticoagulants: Secondary | ICD-10-CM

## 2021-08-26 ENCOUNTER — Other Ambulatory Visit: Payer: Self-pay

## 2021-08-26 ENCOUNTER — Encounter: Payer: Self-pay | Admitting: Hematology & Oncology

## 2021-08-26 ENCOUNTER — Inpatient Hospital Stay: Payer: BC Managed Care – PPO | Admitting: Hematology & Oncology

## 2021-08-26 ENCOUNTER — Inpatient Hospital Stay: Payer: BC Managed Care – PPO | Attending: Hematology & Oncology

## 2021-08-26 VITALS — BP 145/59 | HR 60 | Temp 98.1°F | Resp 16 | Wt 303.0 lb

## 2021-08-26 DIAGNOSIS — Z7901 Long term (current) use of anticoagulants: Secondary | ICD-10-CM

## 2021-08-26 DIAGNOSIS — Z86718 Personal history of other venous thrombosis and embolism: Secondary | ICD-10-CM | POA: Insufficient documentation

## 2021-08-26 DIAGNOSIS — D6862 Lupus anticoagulant syndrome: Secondary | ICD-10-CM | POA: Insufficient documentation

## 2021-08-26 LAB — CBC WITH DIFFERENTIAL (CANCER CENTER ONLY)
Abs Immature Granulocytes: 0.02 10*3/uL (ref 0.00–0.07)
Basophils Absolute: 0.1 10*3/uL (ref 0.0–0.1)
Basophils Relative: 1 %
Eosinophils Absolute: 0.2 10*3/uL (ref 0.0–0.5)
Eosinophils Relative: 3 %
HCT: 41.8 % (ref 39.0–52.0)
Hemoglobin: 13.4 g/dL (ref 13.0–17.0)
Immature Granulocytes: 0 %
Lymphocytes Relative: 41 %
Lymphs Abs: 3 10*3/uL (ref 0.7–4.0)
MCH: 25.9 pg — ABNORMAL LOW (ref 26.0–34.0)
MCHC: 32.1 g/dL (ref 30.0–36.0)
MCV: 80.9 fL (ref 80.0–100.0)
Monocytes Absolute: 0.5 10*3/uL (ref 0.1–1.0)
Monocytes Relative: 6 %
Neutro Abs: 3.7 10*3/uL (ref 1.7–7.7)
Neutrophils Relative %: 49 %
Platelet Count: 164 10*3/uL (ref 150–400)
RBC: 5.17 MIL/uL (ref 4.22–5.81)
RDW: 14.3 % (ref 11.5–15.5)
WBC Count: 7.4 10*3/uL (ref 4.0–10.5)
nRBC: 0 % (ref 0.0–0.2)

## 2021-08-26 LAB — CMP (CANCER CENTER ONLY)
ALT: 18 U/L (ref 0–44)
AST: 17 U/L (ref 15–41)
Albumin: 4.1 g/dL (ref 3.5–5.0)
Alkaline Phosphatase: 72 U/L (ref 38–126)
Anion gap: 7 (ref 5–15)
BUN: 10 mg/dL (ref 8–23)
CO2: 27 mmol/L (ref 22–32)
Calcium: 8.9 mg/dL (ref 8.9–10.3)
Chloride: 102 mmol/L (ref 98–111)
Creatinine: 0.9 mg/dL (ref 0.61–1.24)
GFR, Estimated: >60 mL/min
Glucose, Bld: 111 mg/dL — ABNORMAL HIGH (ref 70–99)
Potassium: 3.8 mmol/L (ref 3.5–5.1)
Sodium: 136 mmol/L (ref 135–145)
Total Bilirubin: 0.4 mg/dL (ref 0.3–1.2)
Total Protein: 7.2 g/dL (ref 6.5–8.1)

## 2021-08-26 NOTE — Progress Notes (Signed)
Hematology and Oncology Follow Up Visit  Mitchell Thornton [id number] 09/28/1955 66 y.o. 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  As expected, he is always traveling.  He has been down to [place].  He has been up to [place].  He is going down to [facility].  Thankfully, he is doing well on the Xarelto.  He has had no problems with blood clots recurrent.  He has had no pain in his legs.  He has had no cough or shortness of breath.  He has had no chest wall pain.  He has had a colonoscopy 2 weeks ago.  He reports that this is okay.  He has had no nausea or vomiting.  There is been no bleeding.  He has had no headache.  Overall, I would say that his performance status is probably ECOG 0.     Medications:  Allergies as of 08/26/2021   No Known Allergies      Medication List        Accurate as of August 26, 2021  8:44 AM. If you have any questions, ask your nurse or doctor.          amLODipine 10 MG tablet Commonly known as: NORVASC TAKE 1 TABLET(10 MG) BY MOUTH DAILY   ferrous sulfate 324 MG Tbec Take 324 mg by mouth.   folic acid 1 MG tablet Commonly known as: FOLVITE TAKE 1 TABLET BY MOUTH EVERY DAY   latanoprost 0.005 % ophthalmic solution Commonly known as: XALATAN Place 1 drop into both eyes at bedtime.   mesalamine 1.2 g EC tablet Commonly known as: LIALDA Take 1.2 g by mouth 2 (two) times daily. Take 2 tablet by mouth every morning.   Xarelto 20 MG Tabs tablet Generic drug: rivaroxaban TAKE 1 TABLET BY MOUTH DAILY WITH SUPPER        Allergies: No Known Allergies  Past Medical History, Surgical history, Social history, and Family History were reviewed and updated.  Review of  Systems: Review of Systems  Constitutional: Negative.   HENT: Negative.    Eyes: Negative.   Respiratory: Negative.    Cardiovascular: Negative.   Gastrointestinal: Negative.   Genitourinary: Negative.   Musculoskeletal: Negative.   Skin: Negative.   Neurological: Negative.   Endo/Heme/Allergies: Negative.   Psychiatric/Behavioral: Negative.       Physical Exam:  weight is 303 lb (137.4 kg) (abnormal). His oral temperature is 98.1 F (36.7 C). His blood pressure is 145/59 (abnormal) and his pulse is 60. His respiration is 16 and oxygen saturation is 100%.   Wt Readings from Last 3 Encounters:  08/26/21 (!) 303 lb (137.4 kg)  08/14/21 299 lb 6 oz (135.8 kg)  08/13/20 300 lb (136.1 kg)    Physical Exam Vitals reviewed.  HENT:     Head: Normocephalic and atraumatic.  Eyes:     Pupils: Pupils are equal, round, and reactive to light.  Cardiovascular:     Rate and Rhythm: Normal rate and regular rhythm.     Heart sounds: Normal heart sounds.  Pulmonary:     Effort: Pulmonary effort is normal.     Breath sounds: Normal breath sounds.  Abdominal:     General: Bowel sounds are normal.  Palpations: Abdomen is soft.  Musculoskeletal:        General: No tenderness or deformity. Normal range of motion.     Cervical back: Normal range of motion.  Lymphadenopathy:     Cervical: No cervical adenopathy.  Skin:    General: Skin is warm and dry.     Findings: No erythema or rash.  Neurological:     Mental Status: He is alert and oriented to person, place, and time.  Psychiatric:        Behavior: Behavior normal.        Thought Content: Thought content normal.        Judgment: Judgment normal.      Lab Results  Component Value Date   WBC 7.4 08/26/2021   HGB 13.4 08/26/2021   HCT 41.8 08/26/2021   MCV 80.9 08/26/2021   PLT 164 08/26/2021   No results found for: "FERRITIN", "IRON", "TIBC", "UIBC", "IRONPCTSAT" Lab Results  Component Value Date   RBC 5.17  08/26/2021   No results found for: "KPAFRELGTCHN", "LAMBDASER", "KAPLAMBRATIO" No results found for: "IGGSERUM", "IGA", "IGMSERUM" No results found for: "TOTALPROTELP", "ALBUMINELP", "A1GS", "A2GS", "BETS", "BETA2SER", "GAMS", "MSPIKE", "SPEI"   Chemistry      Component Value Date/Time   NA 136 08/26/2021 0756   NA 142 01/28/2017 1447   NA 137 09/11/2016 1104   K 3.8 08/26/2021 0756   K 3.9 01/28/2017 1447   K 4.0 09/11/2016 1104   CL 102 08/26/2021 0756   CL 103 01/28/2017 1447   CO2 27 08/26/2021 0756   CO2 29 01/28/2017 1447   CO2 25 09/11/2016 1104   BUN 10 08/26/2021 0756   BUN 8 01/28/2017 1447   BUN 12.5 09/11/2016 1104   CREATININE 0.90 08/26/2021 0756   CREATININE 0.8 01/28/2017 1447   CREATININE 0.9 09/11/2016 1104      Component Value Date/Time   CALCIUM 8.9 08/26/2021 0756   CALCIUM 8.9 01/28/2017 1447   CALCIUM 9.5 09/11/2016 1104   ALKPHOS 72 08/26/2021 0756   ALKPHOS 112 (H) 01/28/2017 1447   ALKPHOS 91 09/11/2016 1104   AST 17 08/26/2021 0756   AST 21 09/11/2016 1104   ALT 18 08/26/2021 0756   ALT 31 01/28/2017 1447   ALT 18 09/11/2016 1104   BILITOT 0.4 08/26/2021 0756   BILITOT 0.46 09/11/2016 1104      Impression and Plan: Mitchell Thornton is a very pleasant 66 yo African American gentleman with 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   From my point of view, everything looks quite good.  He is on Xarelto.  He has had no problems traveling.  I am glad that his quality of life is doing so well.  I think that at this point, we will still plan to get him back yearly.    Mitchell [name], MD 7/25/20238:44 AM

## 2021-09-09 ENCOUNTER — Other Ambulatory Visit: Payer: Self-pay | Admitting: Hematology & Oncology

## 2021-10-15 DIAGNOSIS — H04123 Dry eye syndrome of bilateral lacrimal glands: Secondary | ICD-10-CM | POA: Diagnosis not present

## 2021-10-15 DIAGNOSIS — H2513 Age-related nuclear cataract, bilateral: Secondary | ICD-10-CM | POA: Diagnosis not present

## 2021-10-15 DIAGNOSIS — H401132 Primary open-angle glaucoma, bilateral, moderate stage: Secondary | ICD-10-CM | POA: Diagnosis not present

## 2021-10-15 DIAGNOSIS — H43813 Vitreous degeneration, bilateral: Secondary | ICD-10-CM | POA: Diagnosis not present

## 2021-10-28 ENCOUNTER — Ambulatory Visit: Payer: BC Managed Care – PPO

## 2021-11-03 ENCOUNTER — Other Ambulatory Visit: Payer: Self-pay | Admitting: Internal Medicine

## 2021-11-18 ENCOUNTER — Ambulatory Visit (INDEPENDENT_AMBULATORY_CARE_PROVIDER_SITE_OTHER): Payer: BC Managed Care – PPO

## 2021-11-18 DIAGNOSIS — Z23 Encounter for immunization: Secondary | ICD-10-CM | POA: Diagnosis not present

## 2021-11-18 NOTE — Progress Notes (Signed)
Pt came in for his last Shingles vaccine. He got injected in the right deltoid and tolerated well.

## 2021-12-09 ENCOUNTER — Other Ambulatory Visit: Payer: Self-pay | Admitting: Hematology & Oncology

## 2022-06-04 ENCOUNTER — Other Ambulatory Visit: Payer: Self-pay | Admitting: Hematology & Oncology

## 2022-08-03 ENCOUNTER — Other Ambulatory Visit: Payer: Self-pay | Admitting: Internal Medicine

## 2022-08-18 ENCOUNTER — Ambulatory Visit (INDEPENDENT_AMBULATORY_CARE_PROVIDER_SITE_OTHER): Payer: Commercial Managed Care - PPO | Admitting: Internal Medicine

## 2022-08-18 ENCOUNTER — Encounter: Payer: Self-pay | Admitting: Internal Medicine

## 2022-08-18 VITALS — BP 138/70 | HR 53 | Temp 98.5°F | Resp 18 | Ht 72.0 in | Wt 302.4 lb

## 2022-08-18 DIAGNOSIS — R399 Unspecified symptoms and signs involving the genitourinary system: Secondary | ICD-10-CM

## 2022-08-18 DIAGNOSIS — K51919 Ulcerative colitis, unspecified with unspecified complications: Secondary | ICD-10-CM | POA: Diagnosis not present

## 2022-08-18 DIAGNOSIS — I10 Essential (primary) hypertension: Secondary | ICD-10-CM | POA: Diagnosis not present

## 2022-08-18 DIAGNOSIS — R0683 Snoring: Secondary | ICD-10-CM

## 2022-08-18 DIAGNOSIS — Z0000 Encounter for general adult medical examination without abnormal findings: Secondary | ICD-10-CM

## 2022-08-18 DIAGNOSIS — Z23 Encounter for immunization: Secondary | ICD-10-CM

## 2022-08-18 DIAGNOSIS — Z0001 Encounter for general adult medical examination with abnormal findings: Secondary | ICD-10-CM

## 2022-08-18 DIAGNOSIS — R739 Hyperglycemia, unspecified: Secondary | ICD-10-CM | POA: Diagnosis not present

## 2022-08-18 LAB — CBC WITH DIFFERENTIAL/PLATELET
Basophils Absolute: 0 10*3/uL (ref 0.0–0.1)
Basophils Relative: 0.9 % (ref 0.0–3.0)
Eosinophils Absolute: 0.1 10*3/uL (ref 0.0–0.7)
Eosinophils Relative: 2.2 % (ref 0.0–5.0)
HCT: 40.8 % (ref 39.0–52.0)
Hemoglobin: 12.7 g/dL — ABNORMAL LOW (ref 13.0–17.0)
Lymphocytes Relative: 39.3 % (ref 12.0–46.0)
Lymphs Abs: 2.2 10*3/uL (ref 0.7–4.0)
MCHC: 31.2 g/dL (ref 30.0–36.0)
MCV: 76.3 fl — ABNORMAL LOW (ref 78.0–100.0)
Monocytes Absolute: 0.6 10*3/uL (ref 0.1–1.0)
Monocytes Relative: 11.6 % (ref 3.0–12.0)
Neutro Abs: 2.6 10*3/uL (ref 1.4–7.7)
Neutrophils Relative %: 46 % (ref 43.0–77.0)
Platelets: 193 10*3/uL (ref 150.0–400.0)
RBC: 5.35 Mil/uL (ref 4.22–5.81)
RDW: 15.8 % — ABNORMAL HIGH (ref 11.5–15.5)
WBC: 5.6 10*3/uL (ref 4.0–10.5)

## 2022-08-18 LAB — ALT: ALT: 18 U/L (ref 0–53)

## 2022-08-18 LAB — LIPID PANEL
Cholesterol: 158 mg/dL (ref 0–200)
HDL: 48.3 mg/dL (ref >39.00)
LDL Cholesterol: 100 mg/dL — ABNORMAL HIGH (ref 0–99)
NonHDL: 109.99
Total CHOL/HDL Ratio: 3
Triglycerides: 52 mg/dL (ref 0.0–149.0)
VLDL: 10.4 mg/dL (ref 0.0–40.0)

## 2022-08-18 LAB — URINALYSIS, ROUTINE W REFLEX MICROSCOPIC
Bilirubin Urine: NEGATIVE
Hgb urine dipstick: NEGATIVE
Ketones, ur: NEGATIVE
Leukocytes,Ua: NEGATIVE
Nitrite: NEGATIVE
RBC / HPF: NONE SEEN (ref 0–?)
Specific Gravity, Urine: 1.015 (ref 1.000–1.030)
Total Protein, Urine: NEGATIVE
Urine Glucose: NEGATIVE
Urobilinogen, UA: 0.2 (ref 0.0–1.0)
pH: 7.5 (ref 5.0–8.0)

## 2022-08-18 LAB — BASIC METABOLIC PANEL
BUN: 10 mg/dL (ref 6–23)
CO2: 27 mEq/L (ref 19–32)
Calcium: 9 mg/dL (ref 8.4–10.5)
Chloride: 103 mEq/L (ref 96–112)
Creatinine, Ser: 0.88 mg/dL (ref 0.40–1.50)
GFR: 90.37 mL/min (ref >60.00)
Glucose, Bld: 87 mg/dL (ref 70–99)
Potassium: 4.1 mEq/L (ref 3.5–5.1)
Sodium: 135 mEq/L (ref 135–145)

## 2022-08-18 LAB — AST: AST: 18 U/L (ref 0–37)

## 2022-08-18 LAB — PSA: PSA: 4.73 ng/mL — ABNORMAL HIGH (ref 0.10–4.00)

## 2022-08-18 LAB — HEMOGLOBIN A1C: Hgb A1c MFr Bld: 6.2 % (ref 4.6–6.5)

## 2022-08-18 NOTE — Patient Instructions (Addendum)
Vaccines I recommend: Covid booster Flu shot this fall RSV vaccine  Check the  blood pressure regularly BP GOAL is between 110/65 and  135/85. If it is consistently higher or lower, let me know   Diet: Try to reduce carbohydrate.  Breast pasta potatoes rice Instead is more fruits, vegetables, beans. Feel free to eat lean meats, fish, chicken     GO TO THE LAB : Get the blood work     GO TO THE FRONT DESK, PLEASE SCHEDULE YOUR APPOINTMENTS Come back for   a physical exam in 1 year    "Health Care Power of attorney" ,  "Living will" (Advance care planning documents)  If you already have a living will or healthcare power of attorney, is recommended you bring the copy to be scanned in your chart.   The document will be available to all the doctors you see in the system.  Advance care planning is a process that supports adults in  understanding and sharing their preferences regarding future medical care.  The patient's preferences are recorded in documents called Advance Directives and the can be modified at any time while the patient is in full mental capacity.   If you don't have one, please consider create one.      More information at: [email or web address]

## 2022-08-18 NOTE — Progress Notes (Unsigned)
Subjective:    Patient ID: Mitchell Thornton, male    DOB: 07/01/1955, 67 y.o.   MRN: [id number]  DOS:  08/18/2022 Type of visit - description: CPX  For CPX Feeling well. Again reports nocturia x 4 most nights.  Denies dysuria, gross hematuria. No difficulty urinating During the daytime he does not have much problems. Denies major problems with lower extremity edema. He does not know if he snores too much. Review of Systems See above   Past Medical History:  Diagnosis Date   Colitis    Cscope ~ 2007, L sided, Rx lialda   Glaucoma     Past Surgical History:  Procedure Laterality Date   CYSTECTOMY     from leg     Current Outpatient Medications  Medication Instructions   amLODipine (NORVASC) 10 mg, Oral, Daily   ferrous sulfate 324 mg, Oral   folic acid (FOLVITE) 1 MG tablet TAKE 1 TABLET BY MOUTH EVERY DAY   latanoprost (XALATAN) 0.005 % ophthalmic solution 1 drop, Daily at bedtime   mesalamine (LIALDA) 1.2 g, Oral, 2 times daily, Take 2 tablet by mouth every morning.   XARELTO 20 MG TABS tablet TAKE 1 TABLET BY MOUTH DAILY WITH SUPPER       Objective:   Physical Exam BP 138/70   Pulse (!) 53   Temp 98.5 F (36.9 C) (Oral)   Resp 18   Ht 6' (1.829 m)   Wt (!) 302 lb 6 oz (137.2 kg)   SpO2 97%   BMI 41.01 kg/m  General: Well developed, NAD, BMI noted Neck: No  thyromegaly  HEENT:  Normocephalic . Face symmetric, atraumatic Lungs:  CTA B Normal respiratory effort, no intercostal retractions, no accessory muscle use. Heart: RRR,  no murmur.  Abdomen:  Not distended, soft, non-tender. No rebound or rigidity.   Lower extremities: no pretibial edema bilaterally  Skin: Exposed areas without rash. Not pale. Not jaundice Neurologic:  alert & oriented X3.  Speech normal, gait appropriate for age and unassisted Strength symmetric and appropriate for age.  Psych: Cognition and judgment appear intact.  Cooperative with normal attention span and  concentration.  Behavior appropriate. No anxious or depressed appearing.     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 Hemorrhoids   PLAN: Here for CPX   -Td 07/2017 - s/p shingrix, PNM 20: Today - Vaccines I advise: RSV,  COVID booster, flu shot every fall -- CCS: Cscope 9-12, reports Cscope 01-2016,   Cscope 02-2018, Cscope 07/11/2019, C-scope 08-2021, next per GI -- prostate ca screening:  + FH F age 71, DRE 2023 negative.  Continue nocturia, similar to last year.  Plan: PSA UA urine culture.  Low threshold for urology referral pending results. --Diet and exercise discussed --labs:BMP AST ALT FLP CBC A1c PSA UA ucx -Healthcare POA: Information provided  Hyperglycemia: Check A1c. HTN: BP is very good today, recommend to check at home, continue amlodipine.  Checking labs Morbid obesity: Extensive dietary advice provided Ulcerative colitis: Per GI, check a CBC. Lifelong anticoagulation: Seems to be tolerating well.  Continue Xarelto.  Snoring: Epworth score of 5, negative. RTC 1 year.  === Hyperglycemia: Checking A1c. HTN: BP is very good.  Continue amlodipine, checking labs Morbid obesity: Diet and exercise discussed. Lifelong anticoagulation: To see hematology soon; ROS neg for bleeding Ulcerative colitis, Saw GI 05/06/2021, was felt to be stable, had a colonoscopy this month Allergic tinnitus: See HPI, recommend consistent Flonase. LUTS: See comments under prostate cancer screening Snoring: epworth scored 6, negative, reassess periodically  RTC 1 year

## 2022-08-19 ENCOUNTER — Encounter: Payer: Self-pay | Admitting: Internal Medicine

## 2022-08-19 LAB — URINE CULTURE
MICRO NUMBER:: 15206131
Result:: NO GROWTH
SPECIMEN QUALITY:: ADEQUATE

## 2022-08-19 NOTE — Assessment & Plan Note (Signed)
Here for CPX Hyperglycemia: Check A1c. HTN: BP is very good today, recommend to check at home, continue amlodipine.  Checking labs Morbid obesity: Extensive dietary advice provided Ulcerative colitis: Per GI, check a CBC. Lifelong anticoagulation: Seems to be tolerating well.  Continue Xarelto. Snoring: Epworth score of 5, negative. RTC 1 year.

## 2022-08-19 NOTE — Assessment & Plan Note (Signed)
-   Td 07/2017 - s/p shingrix, PNM 20: Today - Vaccines I advise: RSV,  COVID booster, flu shot every fall -- CCS: Cscope 9-12, reports Cscope 01-2016,   Cscope 02-2018, Cscope 07/11/2019, C-scope 08-2021, next per GI -- prostate ca screening:  + FH, father  age 65, DRE 2023 negative.  Continue nocturia, similar to last year.  Plan: PSA UA urine culture.  Low threshold for urology referral pending results. --Diet and exercise discussed --labs:BMP AST ALT FLP CBC A1c PSA UA ucx -Healthcare POA: Information provided

## 2022-08-20 ENCOUNTER — Telehealth: Payer: Self-pay | Admitting: Internal Medicine

## 2022-08-20 NOTE — Telephone Encounter (Signed)
Please call patient. -Cholesterol has increased, recommend atorvastatin 20 mg nightly.  Send a 64-month supply. -Hemoglobin is a little decreased, fax reports to GI, patient to let me know if has a change in the color of the stools or stomach problems. -PSA elevated.  Needs to be rechecked on RTC - Blood sugar is okay. -Arrange follow-up in the office in 3 months.

## 2022-08-20 NOTE — Telephone Encounter (Signed)
LMOM asking for call back.  

## 2022-08-21 MED ORDER — ATORVASTATIN CALCIUM 20 MG PO TABS: 20.0000 mg | ORAL_TABLET | Freq: Every evening | ORAL | 1 refills | Status: DC

## 2022-08-21 NOTE — Telephone Encounter (Signed)
Pt wife notified of results.  She would like them faxed to her.  Rx sent in, labs faxed to GI, and labs emailed to patient.

## 2022-08-27 ENCOUNTER — Encounter: Payer: Self-pay | Admitting: Hematology & Oncology

## 2022-08-27 ENCOUNTER — Inpatient Hospital Stay: Payer: Commercial Managed Care - PPO | Attending: Family

## 2022-08-27 ENCOUNTER — Other Ambulatory Visit: Payer: Self-pay

## 2022-08-27 ENCOUNTER — Inpatient Hospital Stay: Payer: Commercial Managed Care - PPO | Admitting: Hematology & Oncology

## 2022-08-27 VITALS — BP 142/65 | HR 56 | Temp 98.6°F | Resp 18 | Wt 301.0 lb

## 2022-08-27 DIAGNOSIS — Z86718 Personal history of other venous thrombosis and embolism: Secondary | ICD-10-CM | POA: Diagnosis present

## 2022-08-27 DIAGNOSIS — E119 Type 2 diabetes mellitus without complications: Secondary | ICD-10-CM | POA: Diagnosis not present

## 2022-08-27 DIAGNOSIS — Z7901 Long term (current) use of anticoagulants: Secondary | ICD-10-CM

## 2022-08-27 DIAGNOSIS — D6862 Lupus anticoagulant syndrome: Secondary | ICD-10-CM | POA: Diagnosis not present

## 2022-08-27 DIAGNOSIS — I82401 Acute embolism and thrombosis of unspecified deep veins of right lower extremity: Secondary | ICD-10-CM | POA: Insufficient documentation

## 2022-08-27 DIAGNOSIS — R030 Elevated blood-pressure reading, without diagnosis of hypertension: Secondary | ICD-10-CM | POA: Insufficient documentation

## 2022-08-27 DIAGNOSIS — R634 Abnormal weight loss: Secondary | ICD-10-CM | POA: Diagnosis not present

## 2022-08-27 DIAGNOSIS — Z79899 Other long term (current) drug therapy: Secondary | ICD-10-CM | POA: Insufficient documentation

## 2022-08-27 LAB — CBC WITH DIFFERENTIAL (CANCER CENTER ONLY)
Abs Immature Granulocytes: 0.01 10*3/uL (ref 0.00–0.07)
Basophils Absolute: 0 10*3/uL (ref 0.0–0.1)
Basophils Relative: 1 %
Eosinophils Absolute: 0.2 10*3/uL (ref 0.0–0.5)
Eosinophils Relative: 3 %
HCT: 41.5 % (ref 39.0–52.0)
Hemoglobin: 13.1 g/dL (ref 13.0–17.0)
Immature Granulocytes: 0 %
Lymphocytes Relative: 39 %
Lymphs Abs: 2.5 10*3/uL (ref 0.7–4.0)
MCH: 24.3 pg — ABNORMAL LOW (ref 26.0–34.0)
MCHC: 31.6 g/dL (ref 30.0–36.0)
MCV: 77.1 fL — ABNORMAL LOW (ref 80.0–100.0)
Monocytes Absolute: 0.6 10*3/uL (ref 0.1–1.0)
Monocytes Relative: 9 %
Neutro Abs: 3.2 10*3/uL (ref 1.7–7.7)
Neutrophils Relative %: 48 %
Platelet Count: 190 10*3/uL (ref 150–400)
RBC: 5.38 MIL/uL (ref 4.22–5.81)
RDW: 15 % (ref 11.5–15.5)
Smear Review: NORMAL
WBC Count: 6.4 10*3/uL (ref 4.0–10.5)
nRBC: 0 % (ref 0.0–0.2)

## 2022-08-27 LAB — CMP (CANCER CENTER ONLY)
ALT: 19 U/L (ref 0–44)
AST: 19 U/L (ref 15–41)
Albumin: 4.1 g/dL (ref 3.5–5.0)
Alkaline Phosphatase: 72 U/L (ref 38–126)
Anion gap: 5 (ref 5–15)
BUN: 11 mg/dL (ref 8–23)
CO2: 29 mmol/L (ref 22–32)
Calcium: 9.3 mg/dL (ref 8.9–10.3)
Chloride: 103 mmol/L (ref 98–111)
Creatinine: 0.96 mg/dL (ref 0.61–1.24)
GFR, Estimated: >60 mL/min (ref >60)
Glucose, Bld: 100 mg/dL — ABNORMAL HIGH (ref 70–99)
Potassium: 4.5 mmol/L (ref 3.5–5.1)
Sodium: 137 mmol/L (ref 135–145)
Total Bilirubin: 0.4 mg/dL (ref 0.3–1.2)
Total Protein: 7.6 g/dL (ref 6.5–8.1)

## 2022-08-27 NOTE — Progress Notes (Signed)
Hematology and Oncology Follow Up Visit  Mitchell Thornton [id number] [birth year] 67 y.o. 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  We see him yearly.  Since we last saw him, he has been to the [place].  He has a house down in [place].  His wife can retire this year.  There is some thought that they may move down to [place].  Pravastatin got a masters from [employer].  He is doing some traveling.  Mitchell Thornton has had no problems with the Xarelto.  I told that he is going to be on Xarelto lifelong.  He will need to lose weight.  I think he lost weight, this will certainly help his overall health.  Will help with his heart.  It would help with high blood pressure control.  It would help with diabetes.  Hopefully, he will lose some weight.  He has had no pain.  Send no cough or shortness of breath.  Thankfully, he has had no problems with COVID.  He had a colonoscopy a year ago.  This all looked fine.  He has had no bleeding.  Overall, I would say performance status is probably ECOG 1.   Medications:  Allergies as of 08/27/2022   No Known Allergies      Medication List        Accurate as of August 27, 2022  8:38 AM. If you have any questions, ask your nurse or doctor.          amLODipine 10 MG tablet Commonly known as: NORVASC Take 1 tablet (10 mg total) by mouth daily.   atorvastatin 20 MG tablet Commonly known as: LIPITOR Take 1 tablet (20 mg total) by mouth at bedtime.   ferrous sulfate 324 MG Tbec Take 324 mg by mouth.   folic acid 1 MG tablet Commonly known as: FOLVITE TAKE 1 TABLET BY MOUTH EVERY DAY   latanoprost 0.005 % ophthalmic solution Commonly known as: XALATAN Place 1  drop into both eyes at bedtime.   mesalamine 1.2 g EC tablet Commonly known as: LIALDA Take 1.2 g by mouth 2 (two) times daily. Take 2 tablet by mouth every morning.   Restasis 0.05 % ophthalmic emulsion Generic drug: cycloSPORINE Place 1 drop into both eyes 2 (two) times daily.   Tafluprost (PF) 0.0015 % Soln Apply 1 drop to eye at bedtime.   Xarelto 20 MG Tabs tablet Generic drug: rivaroxaban TAKE 1 TABLET BY MOUTH DAILY WITH SUPPER        Allergies: No Known Allergies  Past Medical History, Surgical history, Social history, and Family History were reviewed and updated.  Review of Systems: Review of Systems  Constitutional: Negative.   HENT: Negative.    Eyes: Negative.   Respiratory: Negative.    Cardiovascular: Negative.   Gastrointestinal: Negative.   Genitourinary: Negative.   Musculoskeletal: Negative.   Skin: Negative.   Neurological: Negative.   Endo/Heme/Allergies: Negative.   Psychiatric/Behavioral: Negative.       Physical Exam:  weight is 301 lb (136.5 kg) (abnormal). His oral temperature is 98.6 F (37 C). His blood pressure is 142/65 (abnormal) and his pulse is 56 (abnormal). His respiration is 18 and oxygen saturation is 100%.   Wt Readings  from Last 3 Encounters:  08/27/22 (!) 301 lb (136.5 kg)  08/18/22 (!) 302 lb 6 oz (137.2 kg)  08/26/21 (!) 303 lb (137.4 kg)    Physical Exam Vitals reviewed.  HENT:     Head: Normocephalic and atraumatic.  Eyes:     Pupils: Pupils are equal, round, and reactive to light.  Cardiovascular:     Rate and Rhythm: Normal rate and regular rhythm.     Heart sounds: Normal heart sounds.  Pulmonary:     Effort: Pulmonary effort is normal.     Breath sounds: Normal breath sounds.  Abdominal:     General: Bowel sounds are normal.     Palpations: Abdomen is soft.  Musculoskeletal:        General: No tenderness or deformity. Normal range of motion.     Cervical back: Normal range of motion.  Lymphadenopathy:      Cervical: No cervical adenopathy.  Skin:    General: Skin is warm and dry.     Findings: No erythema or rash.  Neurological:     Mental Status: He is alert and oriented to person, place, and time.  Psychiatric:        Behavior: Behavior normal.        Thought Content: Thought content normal.        Judgment: Judgment normal.      Lab Results  Component Value Date   WBC 6.4 08/27/2022   HGB 13.1 08/27/2022   HCT 41.5 08/27/2022   MCV 77.1 (L) 08/27/2022   PLT 190 08/27/2022   No results found for: "FERRITIN", "IRON", "TIBC", "UIBC", "IRONPCTSAT" Lab Results  Component Value Date   RBC 5.38 08/27/2022   No results found for: "KPAFRELGTCHN", "LAMBDASER", "KAPLAMBRATIO" No results found for: "IGGSERUM", "IGA", "IGMSERUM" No results found for: "TOTALPROTELP", "ALBUMINELP", "A1GS", "A2GS", "BETS", "BETA2SER", "GAMS", "MSPIKE", "SPEI"   Chemistry      Component Value Date/Time   NA 135 08/18/2022 0828   NA 142 01/28/2017 1447   NA 137 09/11/2016 1104   K 4.1 08/18/2022 0828   K 3.9 01/28/2017 1447   K 4.0 09/11/2016 1104   CL 103 08/18/2022 0828   CL 103 01/28/2017 1447   CO2 27 08/18/2022 0828   CO2 29 01/28/2017 1447   CO2 25 09/11/2016 1104   BUN 10 08/18/2022 0828   BUN 8 01/28/2017 1447   BUN 12.5 09/11/2016 1104   CREATININE 0.88 08/18/2022 0828   CREATININE 0.90 08/26/2021 0756   CREATININE 0.8 01/28/2017 1447   CREATININE 0.9 09/11/2016 1104      Component Value Date/Time   CALCIUM 9.0 08/18/2022 0828   CALCIUM 8.9 01/28/2017 1447   CALCIUM 9.5 09/11/2016 1104   ALKPHOS 72 08/26/2021 0756   ALKPHOS 112 (H) 01/28/2017 1447   ALKPHOS 91 09/11/2016 1104   AST 18 08/18/2022 0828   AST 17 08/26/2021 0756   AST 21 09/11/2016 1104   ALT 18 08/18/2022 0828   ALT 18 08/26/2021 0756   ALT 31 01/28/2017 1447   ALT 18 09/11/2016 1104   BILITOT 0.4 08/26/2021 0756   BILITOT 0.46 09/11/2016 1104      Impression and Plan: Mitchell Thornton is a very pleasant  67 yo African American gentleman with 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  So far, erythema doing quite well.  Again, I told he is going to be on lifelong anticoagulation.  Again I think if he lost weight, this will certainly help  in the future for him.  We will still follow him up once a year.     [name], MD 7/25/20248:38 AM

## 2022-09-01 ENCOUNTER — Other Ambulatory Visit: Payer: Self-pay | Admitting: Hematology & Oncology

## 2022-12-01 ENCOUNTER — Ambulatory Visit: Payer: Commercial Managed Care - PPO | Admitting: Internal Medicine

## 2022-12-01 ENCOUNTER — Encounter: Payer: Self-pay | Admitting: Internal Medicine

## 2022-12-01 VITALS — BP 138/62 | HR 52 | Temp 98.1°F | Resp 18 | Ht 72.0 in | Wt 298.1 lb

## 2022-12-01 DIAGNOSIS — E785 Hyperlipidemia, unspecified: Secondary | ICD-10-CM | POA: Diagnosis not present

## 2022-12-01 DIAGNOSIS — R972 Elevated prostate specific antigen [PSA]: Secondary | ICD-10-CM

## 2022-12-01 DIAGNOSIS — I10 Essential (primary) hypertension: Secondary | ICD-10-CM

## 2022-12-01 LAB — LIPID PANEL
Cholesterol: 93 mg/dL (ref 0–200)
HDL: 43.3 mg/dL (ref >39.00)
LDL Cholesterol: 42 mg/dL (ref 0–99)
NonHDL: 49.78
Total CHOL/HDL Ratio: 2
Triglycerides: 40 mg/dL (ref 0.0–149.0)
VLDL: 8 mg/dL (ref 0.0–40.0)

## 2022-12-01 LAB — ALT: ALT: 31 U/L (ref 0–53)

## 2022-12-01 LAB — AST: AST: 22 U/L (ref 0–37)

## 2022-12-01 LAB — PSA: PSA: 5.75 ng/mL — ABNORMAL HIGH (ref 0.10–4.00)

## 2022-12-01 NOTE — Assessment & Plan Note (Signed)
Increased PSA: Slightly increased PSA, FH >> fatherdxprostate ca age 65, DRE limited but negative.  Previous UA urine culture negative.  Plan: Recheck PSA.  Further advised with results Dyslipidemia: Based on last FLP started atorvastatin, good compliance and tolerance.  Check labs Ulcerative colitis: GI visit 06/12/2022, continue Lialda, next C-scope 08/2023 Vaccine advised: Recommend flu shot and a COVID booster. RTC 08-2023 CPX.  Sooner if needed.

## 2022-12-01 NOTE — Patient Instructions (Addendum)
Vaccines I recommend: Covid booster RSV vaccine Flu shot   Check the  blood pressure regularly Blood pressure goal:  between 110/65 and  135/85. If it is consistently higher or lower, let me know     GO TO THE LAB : Get the blood work     Next visit with me by 08-2023, physical exam.  Come back sooner if needed. Please schedule it at the front desk

## 2022-12-01 NOTE — Progress Notes (Signed)
   Subjective:    Patient ID: Mitchell Thornton, male    DOB: January 06, 1956, 67 y.o.   MRN: [id number]  DOS:  12/01/2022 Type of visit - description: f/u  Follow-up from previous visit. PSA was elevated, his only symptom is nocturia.  Denies dysuria, gross hematuria. Denies lower extremity edema at the end of the day. High cholesterol: Started atorvastatin, good compliance, no apparent side effects.  Review of Systems See above   Past Medical History:  Diagnosis Date   Colitis    Cscope ~ 2007, L sided, Rx lialda   Glaucoma     Past Surgical History:  Procedure Laterality Date   CYSTECTOMY     from leg     Current Outpatient Medications  Medication Instructions   amLODipine (NORVASC) 10 mg, Oral, Daily   atorvastatin (LIPITOR) 20 mg, Oral, Nightly   cycloSPORINE (RESTASIS) 0.05 % ophthalmic emulsion 1 drop, Both Eyes, 2 times daily   ferrous sulfate 324 mg, Oral   folic acid (FOLVITE) 1 MG tablet TAKE 1 TABLET BY MOUTH EVERY DAY   latanoprost (XALATAN) 0.005 % ophthalmic solution 1 drop, Daily at bedtime   mesalamine (LIALDA) 1.2 g, Oral, 2 times daily, Take 2 tablet by mouth every morning.   Tafluprost, PF, 0.0015 % SOLN 1 drop, Ophthalmic, Daily at bedtime   XARELTO 20 MG TABS tablet TAKE 1 TABLET BY MOUTH DAILY WITH SUPPER       Objective:   Physical Exam BP 138/62   Pulse (!) 52   Temp 98.1 F (36.7 C) (Oral)   Resp 18   Ht 6' (1.829 m)   Wt 298 lb 2 oz (135.2 kg)   SpO2 99%   BMI 40.43 kg/m  General:   Well developed, NAD, BMI noted.  HEENT:  Normocephalic . Face symmetric, atraumatic Abdomen:  Not distended, soft, non-tender. No rebound or rigidity. DRE: Very limited due to patient habitus, sphincter tone wnl, no stools, hard to reach prostate but it seems normal in size.  Not tender. Skin: Not pale. Not jaundice Lower extremities: no pretibial edema bilaterally  Neurologic:  alert & oriented X3.  Speech normal, gait appropriate for age and  unassisted Psych--  Cognition and judgment appear intact.  Cooperative with normal attention span and concentration.  Behavior appropriate. No anxious or depressed appearing.     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 Hemorrhoids   PLAN: Increased PSA: Slightly increased PSA, FH >> fatherdxprostate ca age 83, DRE limited but negative.  Previous UA urine culture negative.  Plan: Recheck PSA.  Further advised with results Dyslipidemia: Based on last FLP started atorvastatin, good compliance and tolerance.  Check labs Ulcerative colitis: GI visit 06/12/2022, continue Lialda, next C-scope 08/2023 Vaccine advised: Recommend flu shot and a COVID booster. RTC 08-2023 CPX.  Sooner if needed.

## 2022-12-03 NOTE — Addendum Note (Signed)
Addended by: Conrad Raymond D on: 12/03/2022 09:22 AM   Modules accepted: Orders

## 2022-12-09 ENCOUNTER — Ambulatory Visit: Payer: Commercial Managed Care - PPO | Admitting: Urology

## 2022-12-09 ENCOUNTER — Encounter: Payer: Self-pay | Admitting: Urology

## 2022-12-09 VITALS — BP 149/77 | HR 50 | Ht 72.0 in | Wt 298.0 lb

## 2022-12-09 DIAGNOSIS — R972 Elevated prostate specific antigen [PSA]: Secondary | ICD-10-CM

## 2022-12-09 LAB — MICROSCOPIC EXAMINATION

## 2022-12-09 LAB — URINALYSIS, ROUTINE W REFLEX MICROSCOPIC
Bilirubin, UA: NEGATIVE
Glucose, UA: NEGATIVE
Ketones, UA: NEGATIVE
Leukocytes,UA: NEGATIVE
Nitrite, UA: NEGATIVE
RBC, UA: NEGATIVE
Specific Gravity, UA: 1.015 (ref 1.005–1.030)
Urobilinogen, Ur: 0.2 mg/dL (ref 0.2–1.0)
pH, UA: 8 — ABNORMAL HIGH (ref 5.0–7.5)

## 2022-12-09 NOTE — Progress Notes (Signed)
Assessment: 1. Elevated PSA      Plan: Today I had a long discussion with the patient regarding his elevated PSA along with the issues and controversies regarding prostate cancer early detection.  Given his PSA trend and family history there is significant concern.  As noted, I could not feel his prostate due to his body habitus.  Given this I have recommended prior to pursuing biopsy a multiparametric prostate MRI.  Hopefully this could provide targeting information and facilitate subsequent biopsy.  Patient will follow-up after MRI.  Note:  PATIENT HAS H/O HEMORROIDS AND HAS MILD ANAL STENOSIS.  WOULD RECOMMEND SEDATION AT TIME OF BX  Chief Complaint:   History of Present Illness:  Mitchell Thornton is a 67 y.o. male who is seen in consultation from Wanda Plump, MD for evaluation of elevated PSA. Patient has a past medical history of obesity, hypertension, ulcerative colitis, and hypercoagulable state with history of DVT on Xarelto.    Patient reports minimal lower urinary tract symptoms.  Current IPSS = 6 Patient denies erectile dysfunction Patient does report a family history of prostate cancer in his father who was diagnosed in his early 61s and underwent radiation therapy and died of other causes.  He also has a brother with an enlarged prostate that required treatment.  PSA data: 07/2017  1.53 07/2018  1.56 07/2019  2.20 07/2020  2.41 08/2021  3.58 08/2022  4.73 11/2022 5.75   Past Medical History:  Past Medical History:  Diagnosis Date   Colitis    Cscope ~ 2007, L sided, Rx lialda   Glaucoma     Past Surgical History:  Past Surgical History:  Procedure Laterality Date   CYSTECTOMY     from leg     Allergies:  No Known Allergies  Family History:  Family History  Problem Relation Age of Onset   Diabetes Mother    Deep vein thrombosis Mother        "clots" mother    Hypertension Mother    Prostate cancer Father 91   Aneurysm Father        brain aneurysm    Coronary artery disease Neg Hx    Stroke Neg Hx    Colon cancer Neg Hx     Social History:  Social History   Tobacco Use   Smoking status: Never   Smokeless tobacco: Never   Tobacco comments:    NEVER USED TOBACCO  Vaping Use   Vaping status: Never Used  Substance Use Topics   Alcohol use: No    Alcohol/week: 0.0 standard drinks of alcohol   Drug use: No    Review of symptoms:  Constitutional:  Negative for unexplained weight loss, night sweats, fever, chills ENT:  Negative for nose bleeds, sinus pain, painful swallowing CV:  Negative for chest pain, shortness of breath, exercise intolerance, palpitations, loss of consciousness Resp:  Negative for cough, wheezing, shortness of breath GI:  Negative for nausea, vomiting, diarrhea, bloody stools GU:  Positives noted in HPI; otherwise negative for gross hematuria, dysuria, urinary incontinence Neuro:  Negative for seizures, poor balance, limb weakness, slurred speech Psych:  Negative for lack of energy, depression, anxiety Endocrine:  Negative for polydipsia, polyuria, symptoms of hypoglycemia (dizziness, hunger, sweating) Hematologic:  Negative for anemia, purpura, petechia, prolonged or excessive bleeding, use of anticoagulants  Allergic:  Negative for difficulty breathing or choking as a result of exposure to anything; no shellfish allergy; no allergic response (rash/itch) to materials, foods  Physical exam: There were no vitals taken for this visit. GENERAL APPEARANCE:  Well appearing, well developed, well nourished, NAD   Gu:  NL EXT GENITALIA DRE:  MILD ANALSTENOSIS; hemorroids.  Prostate difficult to feel due to body habitus.  Can only feel distal apex.  Results: UA clear

## 2022-12-10 ENCOUNTER — Other Ambulatory Visit: Payer: Self-pay | Admitting: Hematology & Oncology

## 2022-12-29 ENCOUNTER — Ambulatory Visit
Admission: RE | Admit: 2022-12-29 | Discharge: 2022-12-29 | Disposition: A | Discharge status: Home / Self Care | Payer: Commercial Managed Care - PPO | Source: Ambulatory Visit | Attending: Urology | Admitting: Urology

## 2022-12-29 DIAGNOSIS — R972 Elevated prostate specific antigen [PSA]: Secondary | ICD-10-CM

## 2022-12-29 MED ORDER — GADOPICLENOL 0.5 MMOL/ML IV SOLN
10.0000 mL | Freq: Once | INTRAVENOUS | Status: AC | PRN
  Administered 2022-12-29: 10 mL via INTRAVENOUS

## 2023-01-12 ENCOUNTER — Encounter: Payer: Self-pay | Admitting: Urology

## 2023-01-12 ENCOUNTER — Ambulatory Visit: Payer: Commercial Managed Care - PPO | Admitting: Urology

## 2023-01-12 VITALS — BP 154/79 | HR 59

## 2023-01-12 DIAGNOSIS — R972 Elevated prostate specific antigen [PSA]: Secondary | ICD-10-CM

## 2023-01-12 NOTE — Progress Notes (Signed)
   Assessment: 1. Elevated PSA     Plan: Today I reviewed the MRI with the patient and his wife and discussed options for further evaluation including MRI targeted biopsy.  He had his MRI done in [place] and would prefer to go there for his biopsy.  As noted-he is on Xarelto for hypercoagulable state with history of DVT.  Will discuss with his hematologist Dr. Myna Hidalgo management of his anticoagulation.  At time of bx will provide po sedation due to h/o hemorroids and mild anal stenosis  Chief Complaint: Elevated psa  HPI: Mitchell Thornton is a 67 y.o. male who presents for continued evaluation of elevated PSA. Please see my note 12/09/2022 at the time of initial visit for detailed history and exam. In summary-- Patient has a past medical history of obesity, hypertension, ulcerative colitis, and hypercoagulable state with history of DVT on Xarelto.     Patient reports minimal lower urinary tract symptoms.  Current IPSS = 6 Patient denies erectile dysfunction Patient does report a family history of prostate cancer in his father who was diagnosed in his early 63s and underwent radiation therapy and died of other causes.  He also has a brother with an enlarged prostate that required treatment.   PSA data: 07/2017             1.53 07/2018             1.56 07/2019             2.20 07/2020             2.41 08/2021             3.58 08/2022             4.73 11/2022 5.75 12/29/2022 prostate MRI--IMPRESSION: 1. PI-RADS category 3 lesion of the right posterolateral peripheral zone in the mid gland. Targeting data sent to [facility]. 2. Prostatomegaly and benign prostatic hypertrophy.  97ml volume 3. 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  Portions of the above documentation were copied from a prior visit for review purposes only.  Allergies: No Known Allergies  PMH: Past Medical History:   Diagnosis Date   Colitis    Cscope ~ 2007, L sided, Rx lialda   Glaucoma     PSH: Past Surgical History:  Procedure Laterality Date   CYSTECTOMY     from leg     SH: Social History   Tobacco Use   Smoking status: Never   Smokeless tobacco: Never   Tobacco comments:    NEVER USED TOBACCO  Vaping Use   Vaping status: Never Used  Substance Use Topics   Alcohol use: No    Alcohol/week: 0.0 standard drinks of alcohol   Drug use: No    ROS: Constitutional:  Negative for fever, chills, weight loss CV: Negative for chest pain, previous MI, hypertension Respiratory:  Negative for shortness of breath, wheezing, sleep apnea, frequent cough GI:  Negative for nausea, vomiting, bloody stool, GERD  PE: BP (!) 154/79   Pulse (!) 59  GENERAL APPEARANCE:  Well appearing, well developed, well nourished, NAD

## 2023-01-13 MED ORDER — ALPRAZOLAM 1 MG PO TABS: ORAL_TABLET | ORAL | 0 refills | Status: DC

## 2023-01-13 NOTE — Addendum Note (Signed)
Addended by: Joline Maxcy on: 01/13/2023 08:44 AM   Modules accepted: Orders

## 2023-01-17 ENCOUNTER — Other Ambulatory Visit: Payer: Commercial Managed Care - PPO

## 2023-01-19 ENCOUNTER — Telehealth: Payer: Self-pay | Admitting: Urology

## 2023-01-21 ENCOUNTER — Telehealth: Payer: Self-pay

## 2023-01-21 NOTE — Telephone Encounter (Signed)
Wife called stating that they have had the MRI done and now want to schedule the biopsy. Should that be here or in [place]?   Please advise.

## 2023-01-22 NOTE — Telephone Encounter (Signed)
Called pt wife answers, spoke with wife per DPR. Informed her of the appt date and time for fusion biopsy 03/24/23 @ 2:00pm at the [facility] office location, provided wife with office address and telephone number. Also provided wife with pre biopsy instructions verbally as well as written copy mailed.

## 2023-01-29 ENCOUNTER — Other Ambulatory Visit: Payer: Self-pay | Admitting: Internal Medicine

## 2023-02-16 ENCOUNTER — Other Ambulatory Visit: Payer: Self-pay | Admitting: Internal Medicine

## 2023-03-12 ENCOUNTER — Telehealth: Payer: Self-pay | Admitting: Urology

## 2023-03-12 ENCOUNTER — Telehealth: Payer: Self-pay

## 2023-03-12 NOTE — Telephone Encounter (Signed)
 Patient called stating he has prostate biopsy scheduled for Feb 19th and knows he is supposed to stop his Xarleto but wasn't sure when to stop it, called patient back and informed him to stop Xarelto  3 days prior to biopsy and restart the day after per Dr.Ennever and to make sure his urologist knows so he can send over clearance form if needed. Patient verbalized understanding and denies any other questions or concerns at this time.

## 2023-03-12 NOTE — Telephone Encounter (Signed)
 Pt wife called about a clearance form that we are suppose to be receiving from a providers office for pt to have his biopsy. She wanted to make sure this was received and completed.Can you please advise.

## 2023-03-15 NOTE — Telephone Encounter (Signed)
 LMTRC

## 2023-03-16 NOTE — Telephone Encounter (Signed)
LMTRC

## 2023-03-17 NOTE — Telephone Encounter (Signed)
See previous telephone encounter.

## 2023-03-17 NOTE — Telephone Encounter (Signed)
LMTRC

## 2023-03-22 NOTE — Telephone Encounter (Signed)
 Called x 3. No response. Chart closed.

## 2023-03-23 ENCOUNTER — Telehealth: Payer: Self-pay

## 2023-03-23 NOTE — Telephone Encounter (Signed)
 Wife called to let you know that he was scheduled to have the fusion biopsy tomorrow but due to the weather they have moved it to some time in April.   Wife wants to know if there is somewhere else to get this done?   Do they need to come back and see you in the mean time?

## 2023-03-24 ENCOUNTER — Other Ambulatory Visit: Payer: Self-pay | Admitting: Urology

## 2023-03-25 ENCOUNTER — Telehealth (INDEPENDENT_AMBULATORY_CARE_PROVIDER_SITE_OTHER): Payer: Self-pay

## 2023-03-25 ENCOUNTER — Telehealth: Payer: Self-pay | Admitting: Urology

## 2023-03-25 NOTE — Telephone Encounter (Signed)
 Pt is scheduled for Fusion on 3/28 per Dr. Margo Aye with [facility]. Originally scheduled with [facility] on 2/19  but had to r/s due to weather for 4/25. Hall requested we see pt sooner if possible.   He is requesting a xanax be sent to [facility] in [place].  NKDA.   Pls advise.   Pt aware he will be notified by his pharmacy once the med is erxed. Pt voiced understanding.

## 2023-03-25 NOTE — Telephone Encounter (Signed)
 Pt wife called and stated she is wanting from a phone call from you. Please advise.

## 2023-04-14 MED ORDER — ALPRAZOLAM 1 MG PO TABS: ORAL_TABLET | ORAL | 0 refills | Status: DC

## 2023-04-14 NOTE — Telephone Encounter (Signed)
Done  Vanna Scotland, MD

## 2023-04-30 ENCOUNTER — Ambulatory Visit: Payer: Commercial Managed Care - PPO | Admitting: Urology

## 2023-04-30 VITALS — BP 126/67 | HR 57 | Ht 72.0 in | Wt 298.0 lb

## 2023-04-30 DIAGNOSIS — Z2989 Encounter for other specified prophylactic measures: Secondary | ICD-10-CM | POA: Diagnosis not present

## 2023-04-30 DIAGNOSIS — C61 Malignant neoplasm of prostate: Secondary | ICD-10-CM | POA: Diagnosis not present

## 2023-04-30 DIAGNOSIS — R9389 Abnormal findings on diagnostic imaging of other specified body structures: Secondary | ICD-10-CM

## 2023-04-30 DIAGNOSIS — Z792 Long term (current) use of antibiotics: Secondary | ICD-10-CM

## 2023-04-30 DIAGNOSIS — R972 Elevated prostate specific antigen [PSA]: Secondary | ICD-10-CM | POA: Diagnosis not present

## 2023-04-30 MED ORDER — LEVOFLOXACIN 500 MG PO TABS
500.0000 mg | ORAL_TABLET | Freq: Once | ORAL | Status: AC
  Administered 2023-04-30: 500 mg via ORAL

## 2023-04-30 MED ORDER — GENTAMICIN SULFATE 40 MG/ML IJ SOLN
80.0000 mg | Freq: Once | INTRAMUSCULAR | Status: AC
  Administered 2023-04-30: 80 mg via INTRAMUSCULAR

## 2023-04-30 NOTE — Patient Instructions (Signed)
 Transrectal Prostate Biopsy/Fusion Biopsy Patient Education and Post Procedure Instructions    -Definition A prostate biopsy is the removal of a small amount of tissue from the prostate gland. The tissue is examined to determine whether there is cancer.  -Reasons for Procedure A prostate biopsy is usually done after an abnormal finding by: Digital rectal exam Prostate specific antigen (PSA) blood test A prostate biopsy is the only way to find out if cancer cells are present.  -Possible Complications Problems from the procedure are rare, but all procedures have some risk including: Infection Bruising or lengthy bleeding from the rectum, or in urine or semen Difficulty urinating Reactions to anesthesia Factors that may increase the risk of complications include: Smoking History of bleeding disorders or easy bruising Use of any medications, over-the-counter medications, or herbal supplements Sensitivity or allergy to latex, medications, or anesthesia.  -Prior to Procedure Talk to your doctor about your medications. Blood thinning medications including aspirin should be stopped 1 week prior to procedure. If prescribed by your cardiologist we may need approval before stopping medications. Use a Fleets enema 2 hours before the procedure. Can be purchased at your pharmacy. Antibiotics will be administered in the clinic prior to procedure.  Please make sure you eat a light meal prior to coming in for your appointment. This can help prevent lightheadedness during the procedure and upset stomach from antibiotics. Please bring someone with you to the procedure to drive you home.  -Anesthesia Transrectal biopsy: Local anesthesia--Just the area that is being operated on is numbed using an injectable anesthetic.  -Description of the Procedure Transrectal biopsy--Your doctor will insert a small ultrasound device into the rectum. This device will produce sound waves to create an image of the  prostate. These images will help guide placement of the needle. Your doctor will then insert the needle through the wall of the rectum and into the prostate gland. The procedure should take approximately 15-30 minutes.  -Will It Hurt? You may have discomfort and soreness at the biopsy site. Pain and discomfort after the procedure can be managed with medications.  -Postoperative Care When you return home after the procedure, do the following to help ensure a smooth recovery: Stay hydrated. Drink plenty of fluids for the next few days. Avoid difficult physical activity the day and evening of the procedure. Keep in mind that you may see blood in your urine, stool, or semen for several days. Resume any medications that were stopped when you are advised to do so.  After the sample is taken, it will be sent to a pathologist for examination under a microscope. This doctor will analyze the sample for cancer. You will be scheduled for an appointment to discuss results. If cancer is present, your doctor will work with you to develop a treatment plan.   -Call Your Doctor or Seek Immediate Medical Attention It is important to monitor your recovery. Alert your doctor to any problems. If any of the following occur, call your doctor or go to the emergency room: Fever 100.5 or greater within 1 week post procedure go directly to ER Call the office for: Blood in the urine more than 1 week or in semen for more than 6 weeks post-biopsy Pain that you cannot control with the medications you have been given Pain, burning, urgency, or frequency of urination Cough, shortness of breath, or chest pain- if severe go to ER Heavy rectal bleeding or bleeding that lasts more than 1 week after the biopsy If you have  any questions or concerns please contact our office at [facility]  [facility] [place] [address] [place], [place] [address]  [phone]

## 2023-04-30 NOTE — Progress Notes (Signed)
   04/30/23  Indication: 68 year old male with rising PSA, PI-RADS 3 lesion on MRI  MRI Fusion Prostate Biopsy Procedure   Informed consent was obtained, and we discussed the risks of bleeding and infection/sepsis. A time out was performed to ensure correct patient identity.  Pre-Procedure: - Last PSA Level: 5.75 - Gentamicin and levaquin given for antibiotic prophylaxis -Prostate measured 97 g on MRI, PSA density 0.059 - No significant hypoechoic or median lobe noted  Procedure: - Prostate block performed using 10 cc 1% lidocaine  - MRI fusion biopsy was performed, and 3 biopsies were taken from the ROI PIRADS3 lesion - Standard biopsies taken from sextant areas, 12 under ultrasound guidance. - Total of 15 cores taken(additional samples included at the bilateral medial bases  Post-Procedure: - Patient tolerated the procedure well - He was counseled to seek immediate medical attention if experiences significant bleeding, fevers, or severe pain - Return in one week to discuss biopsy results  Assessment/ Plan: Will follow up in 1-2 weeks to discuss pathology with Dr. Laurey Morale, MD

## 2023-05-13 ENCOUNTER — Ambulatory Visit: Admitting: Urology

## 2023-05-13 ENCOUNTER — Encounter: Payer: Self-pay | Admitting: Urology

## 2023-05-13 VITALS — BP 169/78 | HR 60

## 2023-05-13 DIAGNOSIS — C61 Malignant neoplasm of prostate: Secondary | ICD-10-CM

## 2023-05-13 DIAGNOSIS — R972 Elevated prostate specific antigen [PSA]: Secondary | ICD-10-CM | POA: Diagnosis not present

## 2023-05-13 DIAGNOSIS — N400 Enlarged prostate without lower urinary tract symptoms: Secondary | ICD-10-CM | POA: Diagnosis not present

## 2023-05-13 LAB — URINALYSIS, ROUTINE W REFLEX MICROSCOPIC
Bilirubin, UA: NEGATIVE
Glucose, UA: NEGATIVE
Ketones, UA: NEGATIVE
Leukocytes,UA: NEGATIVE
Nitrite, UA: NEGATIVE
Protein,UA: NEGATIVE
Specific Gravity, UA: 1.01 (ref 1.005–1.030)
Urobilinogen, Ur: 0.2 mg/dL (ref 0.2–1.0)
pH, UA: 7 (ref 5.0–7.5)

## 2023-05-13 LAB — MICROSCOPIC EXAMINATION: Bacteria, UA: NONE SEEN

## 2023-05-13 NOTE — Progress Notes (Signed)
 Assessment: 1. Elevated PSA   2.   BPH with 100gm gland  Plan: Today I had a long and detailed discussion with the patient and his wife regarding his low risk prostate cancer including its natural history and standard treatment options.  As noted his MRI did not show any high-grade lesions and he only had 1 of 15 biopsy cores positive for low-volume Gleason 3+3 equal 6.  I told him at this time I would recommend active surveillance but will obtain confirmatory genomic risk with decipher. Patient will follow-up in approximately 3 months with PSA prior to visit  Chief Complaint: Prostate cancer  HPI: Mitchell Thornton is a 68 y.o. male who presents for FU regarding recently diagnosed prostate cancer. Pathology demonstrated a single core from the left lateral apex with Gleason 3+3 = 6 adenocarcinoma with 18% core involvement.  All of the other 14 biopsies including targeted biopsy were negative. Please see my note 12/09/2022 at the time of initial visit for detailed history and exam. In summary-- Patient has a past medical history of obesity, hypertension, ulcerative colitis, and hypercoagulable state with history of DVT on Xarelto.     Patient reports minimal lower urinary tract symptoms.  Current IPSS = 6 Patient denies erectile dysfunction Patient does report a family history of prostate cancer in his father who was diagnosed in his early 77s and underwent radiation therapy and died of other causes.  He also has a brother with an enlarged prostate that required treatment.   PSA data: 07/2017             1.53 07/2018             1.56 07/2019             2.20 07/2020             2.41 08/2021             3.58 08/2022             4.73 11/2022 5.75 11/26/2024prostate MRI--IMPRESSION: 1. PI-RADS category 3 lesion of the right posterolateral peripheral zone in the mid gland. Targeting data sent to [facility]. 2. Prostatomegaly and benign prostatic hypertrophy.  97ml volume 3. Degenerative right  hip arthropathy with osteochondral lesion along the anterior femoral head and acetabulum and mild right hip synovitis. 04/2023  MRI fusion biopsy-single core from the left lateral apex with Gleason 3+3 = 6 adenocarcinoma with 18% core involvement.  All of the biopsies including targeted biopsy were negative.  Portions of the above documentation were copied from a prior visit for review purposes only.  Allergies: No Known Allergies  PMH: Past Medical History:  Diagnosis Date   Colitis    Cscope ~ 2007, L sided, Rx lialda   Glaucoma     PSH: Past Surgical History:  Procedure Laterality Date   CYSTECTOMY     from leg     SH: Social History   Tobacco Use   Smoking status: Never   Smokeless tobacco: Never   Tobacco comments:    NEVER USED TOBACCO  Vaping Use   Vaping status: Never Used  Substance Use Topics   Alcohol use: No    Alcohol/week: 0.0 standard drinks of alcohol   Drug use: No    ROS: Constitutional:  Negative for fever, chills, weight loss CV: Negative for chest pain, previous MI, hypertension Respiratory:  Negative for shortness of breath, wheezing, sleep apnea, frequent cough GI:  Negative for nausea, vomiting, bloody stool,  GERD  PE: Vitals:   05/13/23 1113  BP: (!) 169/78  Pulse: 60    GENERAL APPEARANCE:  Well appearing, well developed, well nourished, NAD

## 2023-05-19 ENCOUNTER — Other Ambulatory Visit: Payer: Commercial Managed Care - PPO | Admitting: Urology

## 2023-06-02 ENCOUNTER — Encounter: Payer: Self-pay | Admitting: Urology

## 2023-06-07 ENCOUNTER — Other Ambulatory Visit: Payer: Self-pay | Admitting: Urology

## 2023-06-07 ENCOUNTER — Other Ambulatory Visit: Payer: Self-pay | Admitting: Hematology & Oncology

## 2023-06-07 ENCOUNTER — Telehealth: Payer: Self-pay | Admitting: Urology

## 2023-06-07 DIAGNOSIS — N401 Enlarged prostate with lower urinary tract symptoms: Secondary | ICD-10-CM

## 2023-06-07 MED ORDER — FINASTERIDE 5 MG PO TABS: 5.0000 mg | ORAL_TABLET | Freq: Every day | ORAL | 11 refills | Status: DC

## 2023-06-07 NOTE — Telephone Encounter (Signed)
 Decipher = low risk  Results discussed with patient's wife by phone. AS remains a reasonable  option for management. He would like to start finasteride as well. Rx sent to pharmacy.

## 2023-07-14 ENCOUNTER — Other Ambulatory Visit: Payer: Self-pay

## 2023-07-14 ENCOUNTER — Other Ambulatory Visit

## 2023-07-14 DIAGNOSIS — C61 Malignant neoplasm of prostate: Secondary | ICD-10-CM

## 2023-07-15 ENCOUNTER — Other Ambulatory Visit

## 2023-07-15 LAB — PSA: Prostate Specific Ag, Serum: 3.3 ng/mL (ref 0.0–4.0)

## 2023-07-22 ENCOUNTER — Ambulatory Visit: Admitting: Urology

## 2023-07-22 ENCOUNTER — Encounter: Payer: Self-pay | Admitting: Urology

## 2023-07-22 VITALS — BP 149/75 | HR 59 | Ht 72.0 in | Wt 300.0 lb

## 2023-07-22 DIAGNOSIS — N138 Other obstructive and reflux uropathy: Secondary | ICD-10-CM | POA: Insufficient documentation

## 2023-07-22 DIAGNOSIS — C61 Malignant neoplasm of prostate: Secondary | ICD-10-CM | POA: Insufficient documentation

## 2023-07-22 DIAGNOSIS — N401 Enlarged prostate with lower urinary tract symptoms: Secondary | ICD-10-CM | POA: Diagnosis not present

## 2023-07-22 LAB — URINALYSIS, ROUTINE W REFLEX MICROSCOPIC
Bilirubin, UA: NEGATIVE
Glucose, UA: NEGATIVE
Ketones, UA: NEGATIVE
Leukocytes,UA: NEGATIVE
Nitrite, UA: NEGATIVE
Protein,UA: NEGATIVE
Specific Gravity, UA: 1.01 (ref 1.005–1.030)
Urobilinogen, Ur: 0.2 mg/dL (ref 0.2–1.0)
pH, UA: 7 (ref 5.0–7.5)

## 2023-07-22 LAB — MICROSCOPIC EXAMINATION: Bacteria, UA: NONE SEEN

## 2023-07-22 NOTE — Progress Notes (Signed)
 Assessment: 1. Prostate cancer (HCC); GG 1 on bx 3/25; low risk on active surveillance   2. Benign prostatic hyperplasia with urinary obstruction     Plan: I personally reviewed the patient's chart including provider notes, lab and imaging results. Continue finasteride  5 mg daily.  I discussed the role of active surveillance for management of low risk and favorable intermediate risk prostate cancer. The advantages of active surveillance were discussed including the following:  - Somewhere between 50% and 68% of eligible patients may safely avoid treatment for at least 10 years - Patient's will avoid possible side effects of definitive therapy that may be unnecessary - Quality of life/normal activities will be less affected while on active surveillance - Risk of unnecessary treatment of small, indolent cancers will be reduced  Limitations of active surveillance discussed including: -Between 32% and 50% of patients will require treatment by 10 years, although treatment delays do not seem to impact cure rate -Although the risk is very low (<0.5% according to most published series), it is possible for cancers progressed to a regional or metastatic stage while on active surveillance  I also discussed that patients who choose active surveillance should have regular follow-up including PSA testing approximately every 6 months, DRE annually, confirmatory biopsy approximately 1 year after initial diagnosis, multiparametric MRI every 12 months or as clinically indicated.  Return to office in 6 months with previsit PSA.  Chief Complaint: Chief Complaint  Patient presents with   Prostate Cancer    HPI: Mitchell Thornton is a 68 y.o. male who presents for continued evaluation of low risk prostate cancer and BPH with lower urinary tract symptoms. He was previously followed by Dr. Del Favia and was last seen in April 2025.  Prostate Cancer: He was evaluated for elevated PSA. PSA data: 07/2017              1.53 07/2018             1.56 07/2019             2.20 07/2020             2.41 08/2021             3.58 08/2022             4.73 11/2022 5.75 12/29/2022  prostate MRI--IMPRESSION: 1. PI-RADS category 3 lesion of the right posterolateral peripheral zone in the mid gland. Targeting data sent to [facility]. 2. Prostatomegaly and benign prostatic hypertrophy.  97ml volume 3. 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 04/2023             MRI fusion biopsy-single core from the left lateral apex with Gleason 3+3 = 6 adenocarcinoma with 18% core involvement.  All of the biopsies including targeted biopsy were negative. Decipher test Low risk group 07/2023  3.3  BPH with LUTS: Patient reports minimal lower urinary tract symptoms.  IPSS = 6 Patient denies erectile dysfunction  He was started on finasteride  5 mg daily for his large volume BPH with LUTS.  Patient has a past medical history of obesity, hypertension, ulcerative colitis, and hypercoagulable state with history of DVT on Xarelto .    He returns today for follow-up.  He is doing well.  He continues on finasteride  5 mg daily and is tolerating the medication.  His lower urinary tract symptoms are fairly stable.  He does have some frequency and nocturia.  No  dysuria or gross hematuria. IPSS = 15/2.   Portions of the above documentation were copied from a prior visit for review purposes only.  Allergies: No Known Allergies  PMH: Past Medical History:  Diagnosis Date   Colitis    Cscope ~ 2007, L sided, Rx lialda   Glaucoma     PSH: Past Surgical History:  Procedure Laterality Date   CYSTECTOMY     from leg     SH: Social History   Tobacco Use   Smoking status: Never   Smokeless tobacco: Never   Tobacco comments:    NEVER USED TOBACCO  Vaping Use   Vaping status: Never Used  Substance Use Topics   Alcohol use: No     Alcohol/week: 0.0 standard drinks of alcohol   Drug use: No    ROS: Constitutional:  Negative for fever, chills, weight loss CV: Negative for chest pain, previous MI, hypertension Respiratory:  Negative for shortness of breath, wheezing, sleep apnea, frequent cough GI:  Negative for nausea, vomiting, bloody stool, GERD  PE: BP (!) 149/75   Pulse (!) 59   Ht 6' (1.829 m)   Wt 300 lb (136.1 kg)   BMI 40.69 kg/m  GENERAL APPEARANCE:  Well appearing, well developed, well nourished, NAD HEENT:  Atraumatic, normocephalic, oropharynx clear NECK:  Supple without lymphadenopathy or thyromegaly ABDOMEN:  Soft, non-tender, no masses EXTREMITIES:  Moves all extremities well, without clubbing, cyanosis, or edema NEUROLOGIC:  Alert and oriented x 3, normal gait, CN II-XII grossly intact MENTAL STATUS:  appropriate BACK:  Non-tender to palpation, No CVAT SKIN:  Warm, dry, and intact   Results: U/A: 0-WBCs, 0-2 RBCs

## 2023-07-25 ENCOUNTER — Other Ambulatory Visit: Payer: Self-pay | Admitting: Internal Medicine

## 2023-08-19 ENCOUNTER — Other Ambulatory Visit: Payer: Self-pay | Admitting: Internal Medicine

## 2023-08-24 ENCOUNTER — Ambulatory Visit (INDEPENDENT_AMBULATORY_CARE_PROVIDER_SITE_OTHER): Payer: Commercial Managed Care - PPO | Admitting: Internal Medicine

## 2023-08-24 ENCOUNTER — Encounter: Payer: Self-pay | Admitting: Internal Medicine

## 2023-08-24 VITALS — BP 134/68 | HR 59 | Temp 98.0°F | Resp 18 | Ht 72.0 in | Wt 306.4 lb

## 2023-08-24 DIAGNOSIS — E785 Hyperlipidemia, unspecified: Secondary | ICD-10-CM | POA: Diagnosis not present

## 2023-08-24 DIAGNOSIS — R739 Hyperglycemia, unspecified: Secondary | ICD-10-CM

## 2023-08-24 DIAGNOSIS — Z0000 Encounter for general adult medical examination without abnormal findings: Secondary | ICD-10-CM | POA: Diagnosis not present

## 2023-08-24 DIAGNOSIS — I10 Essential (primary) hypertension: Secondary | ICD-10-CM

## 2023-08-24 LAB — COMPREHENSIVE METABOLIC PANEL WITH GFR
ALT: 19 U/L (ref 0–53)
AST: 16 U/L (ref 0–37)
Albumin: 4 g/dL (ref 3.5–5.2)
Alkaline Phosphatase: 70 U/L (ref 39–117)
BUN: 8 mg/dL (ref 6–23)
CO2: 29 meq/L (ref 19–32)
Calcium: 8.8 mg/dL (ref 8.4–10.5)
Chloride: 103 meq/L (ref 96–112)
Creatinine, Ser: 0.74 mg/dL (ref 0.40–1.50)
GFR: 94.55 mL/min (ref >60.00)
Glucose, Bld: 102 mg/dL — ABNORMAL HIGH (ref 70–99)
Potassium: 4.3 meq/L (ref 3.5–5.1)
Sodium: 136 meq/L (ref 135–145)
Total Bilirubin: 0.5 mg/dL (ref 0.2–1.2)
Total Protein: 7 g/dL (ref 6.0–8.3)

## 2023-08-24 LAB — CBC WITH DIFFERENTIAL/PLATELET
Basophils Absolute: 0 K/uL (ref 0.0–0.1)
Basophils Relative: 0.5 % (ref 0.0–3.0)
Eosinophils Absolute: 0.1 K/uL (ref 0.0–0.7)
Eosinophils Relative: 2.7 % (ref 0.0–5.0)
HCT: 42.6 % (ref 39.0–52.0)
Hemoglobin: 13.8 g/dL (ref 13.0–17.0)
Lymphocytes Relative: 34.2 % (ref 12.0–46.0)
Lymphs Abs: 1.9 K/uL (ref 0.7–4.0)
MCHC: 32.3 g/dL (ref 30.0–36.0)
MCV: 81 fl (ref 78.0–100.0)
Monocytes Absolute: 0.6 K/uL (ref 0.1–1.0)
Monocytes Relative: 10.4 % (ref 3.0–12.0)
Neutro Abs: 2.8 K/uL (ref 1.4–7.7)
Neutrophils Relative %: 52.2 % (ref 43.0–77.0)
Platelets: 166 K/uL (ref 150.0–400.0)
RBC: 5.26 Mil/uL (ref 4.22–5.81)
RDW: 14.7 % (ref 11.5–15.5)
WBC: 5.4 K/uL (ref 4.0–10.5)

## 2023-08-24 LAB — LIPID PANEL
Cholesterol: 90 mg/dL (ref 0–200)
HDL: 39.3 mg/dL (ref >39.00)
LDL Cholesterol: 43 mg/dL (ref 0–99)
NonHDL: 50.72
Total CHOL/HDL Ratio: 2
Triglycerides: 37 mg/dL (ref 0.0–149.0)
VLDL: 7.4 mg/dL (ref 0.0–40.0)

## 2023-08-24 LAB — HEMOGLOBIN A1C: Hgb A1c MFr Bld: 6.3 % (ref 4.6–6.5)

## 2023-08-24 NOTE — Patient Instructions (Addendum)
 Vaccines I recommend: COVID booster at your convenience Flu shot this fall  For allergies: Claritin 10 mg: 1 tablet daily as needed Flonase: 2 sprays on each side of the nose daily.  As needed.  Check your blood pressure regularly Blood pressure goal:  between 110/65 and  135/85. If it is consistently higher or lower, let me know     GO TO THE LAB :  Get the blood work   Your results will be posted on MyChart with my comments  Next office visit for a checkup in 6 months Please make an appointment before you leave today       Health Care Power of attorney (Also know as a  Living will or  Advance care planning documents)  If you already have a living will or healthcare power of attorney, is recommended you bring the copy to be scanned in your chart.   The document will be available to all the doctors you see in the system.  If you are over 34 y/o and don't have the document, please read:  Advance care planning is a process that supports adults in  understanding and sharing their preferences regarding future medical care.  The patient's preferences are recorded in documents called Advance Directives and the can be modified at any time while the patient is in full mental capacity.     More information at: [email or web address]  STOP BY THE FIRST FLOOR:  get the XR

## 2023-08-24 NOTE — Assessment & Plan Note (Signed)
 Here for CPX -Td 07/2017 - s/p shingrix , PNM 20: 2024 - Vaccines I advise: COVID booster, flu shot every fall -- CCS: Cscope 9-12, reports Cscope 01-2016,   Cscope 02-2018, Cscope 07/11/2019, C-scope 08-2021, next per GI -- prostate ca screening:   see comments under prostate cancer --Diet and exercise discussed --labs:CMP FLP CBC A1c -Healthcare POA: Information provided

## 2023-08-24 NOTE — Assessment & Plan Note (Signed)
 Here for CPX  Other issues: Hyperglycemia: Checking A1c HTN: Good compliance with amlodipine , reports ambulatory BPs normal, recommend to continue checking. High cholesterol: Had a good response to atorvastatin .  Check labs. Ulcerative colitis: Currently asymptomatic, on Lialda, to see GI in few months Prostate cancer - Last PSA was elevated, prostate cancer Dx 04/2023.  Low-volume, Rx surveillance. Morbid obesity: Dietary advice provided. Allergies: Recommend Claritin and Flonase as needed RTC 6 months

## 2023-08-24 NOTE — Progress Notes (Signed)
 Subjective:    Patient ID: Mitchell Thornton, male    DOB: 13-Oct-1955, 68 y.o.   MRN: [id number]  DOS:  08/24/2023 Type of visit - description: CPX  Here for CPX. Other than allergies he feels well. Complaining of on and off itchy throat, mild cough, postnasal dripping since I moved to this area.  Review of Systems  Other than above, a 14 point review of systems is negative   Past Medical History:  Diagnosis Date   Colitis    Cscope ~ 2007, L sided, Rx lialda   Glaucoma     Past Surgical History:  Procedure Laterality Date   CYSTECTOMY     from leg     Current Outpatient Medications  Medication Instructions   ALPHAGAN P 0.1 % SOLN 1 drop, Both Eyes, 3 times daily   amLODipine  (NORVASC ) 10 mg, Oral, Daily   atorvastatin  (LIPITOR) 20 mg, Oral, Daily at bedtime   cycloSPORINE (RESTASIS) 0.05 % ophthalmic emulsion 1 drop, 2 times daily   ferrous sulfate 324 mg   finasteride  (PROSCAR ) 5 mg, Oral, Daily   folic acid  (FOLVITE ) 1 mg, Oral, Daily   latanoprost (XALATAN) 0.005 % ophthalmic solution 1 drop, Daily at bedtime   mesalamine (LIALDA) 1.2 g, 2 times daily   Tafluprost, PF, 0.0015 % SOLN 1 drop, Daily at bedtime   Xarelto  20 mg, Oral, Daily with supper       Objective:   Physical Exam BP 134/68   Pulse (!) 59   Temp 98 F (36.7 C) (Oral)   Resp 18   Ht 6' (1.829 m)   Wt (!) 306 lb 6 oz (139 kg)   SpO2 99%   BMI 41.55 kg/m  General: Well developed, NAD, BMI noted Neck: No  thyromegaly  HEENT:  Normocephalic . Face symmetric, atraumatic.  TMs normal.  Throat symmetric no red Lungs:  CTA B Normal respiratory effort, no intercostal retractions, no accessory muscle use. Heart: RRR,  no murmur.  Abdomen:  Not distended, soft, non-tender. No rebound or rigidity.   Lower extremities: no pretibial edema bilaterally  Skin: Exposed areas without rash. Not pale. Not jaundice Neurologic:  alert & oriented X3.  Speech normal, gait appropriate for age and  unassisted Strength symmetric and appropriate for age.  Psych: Cognition and judgment appear intact.  Cooperative with normal attention span and concentration.  Behavior appropriate. No anxious or depressed appearing.     Assessment     Assessment Hyperglycemia -- a1c 5.9 (03-2015) HTN Dyslipidemia Prostate cancer Dx 04/2023. 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 Hemorrhoids   PLAN: Here for CPX -Td 07/2017 - s/p shingrix , PNM 20: 2024 - Vaccines I advise: COVID booster, flu shot every fall -- CCS: Cscope 9-12, reports Cscope 01-2016,   Cscope 02-2018, Cscope 07/11/2019, C-scope 08-2021, next per GI -- prostate ca screening:   see comments under prostate cancer --Diet and exercise discussed --labs:CMP FLP CBC A1c -Healthcare POA: Information provided  Other issues: Hyperglycemia: Checking A1c HTN: Good compliance with amlodipine , reports ambulatory BPs normal, recommend to continue checking. High cholesterol: Had a good response to atorvastatin .  Check labs. Ulcerative colitis: Currently  asymptomatic, on Lialda, to see GI in few months Prostate cancer - Last PSA was elevated, prostate cancer Dx 04/2023.  Low-volume, Rx surveillance. Morbid obesity: Dietary advice provided. Allergies: Recommend Claritin and Flonase as needed RTC 6 months

## 2023-08-25 ENCOUNTER — Ambulatory Visit: Payer: Self-pay | Admitting: Internal Medicine

## 2023-08-27 ENCOUNTER — Inpatient Hospital Stay: Payer: Commercial Managed Care - PPO | Attending: Hematology & Oncology

## 2023-08-27 ENCOUNTER — Encounter: Payer: Self-pay | Admitting: Family

## 2023-08-27 ENCOUNTER — Inpatient Hospital Stay: Payer: Commercial Managed Care - PPO | Admitting: Family

## 2023-08-27 VITALS — BP 136/60 | HR 50 | Temp 98.9°F | Resp 17 | Wt 303.0 lb

## 2023-08-27 DIAGNOSIS — D6862 Lupus anticoagulant syndrome: Secondary | ICD-10-CM | POA: Insufficient documentation

## 2023-08-27 DIAGNOSIS — Z7901 Long term (current) use of anticoagulants: Secondary | ICD-10-CM | POA: Insufficient documentation

## 2023-08-27 DIAGNOSIS — Z79899 Other long term (current) drug therapy: Secondary | ICD-10-CM | POA: Insufficient documentation

## 2023-08-27 DIAGNOSIS — Z86718 Personal history of other venous thrombosis and embolism: Secondary | ICD-10-CM | POA: Diagnosis present

## 2023-08-27 LAB — CBC WITH DIFFERENTIAL (CANCER CENTER ONLY)
Abs Immature Granulocytes: 0.01 K/uL (ref 0.00–0.07)
Basophils Absolute: 0 K/uL (ref 0.0–0.1)
Basophils Relative: 1 %
Eosinophils Absolute: 0.1 K/uL (ref 0.0–0.5)
Eosinophils Relative: 2 %
HCT: 43.2 % (ref 39.0–52.0)
Hemoglobin: 14.1 g/dL (ref 13.0–17.0)
Immature Granulocytes: 0 %
Lymphocytes Relative: 33 %
Lymphs Abs: 1.9 K/uL (ref 0.7–4.0)
MCH: 26.5 pg (ref 26.0–34.0)
MCHC: 32.6 g/dL (ref 30.0–36.0)
MCV: 81.1 fL (ref 80.0–100.0)
Monocytes Absolute: 0.6 K/uL (ref 0.1–1.0)
Monocytes Relative: 10 %
Neutro Abs: 3.1 K/uL (ref 1.7–7.7)
Neutrophils Relative %: 54 %
Platelet Count: 196 K/uL (ref 150–400)
RBC: 5.33 MIL/uL (ref 4.22–5.81)
RDW: 14.4 % (ref 11.5–15.5)
WBC Count: 5.7 K/uL (ref 4.0–10.5)
nRBC: 0 % (ref 0.0–0.2)

## 2023-08-27 LAB — CMP (CANCER CENTER ONLY)
ALT: 23 U/L (ref 0–44)
AST: 23 U/L (ref 15–41)
Albumin: 4.1 g/dL (ref 3.5–5.0)
Alkaline Phosphatase: 82 U/L (ref 38–126)
Anion gap: 9 (ref 5–15)
BUN: 8 mg/dL (ref 8–23)
CO2: 25 mmol/L (ref 22–32)
Calcium: 8.9 mg/dL (ref 8.9–10.3)
Chloride: 102 mmol/L (ref 98–111)
Creatinine: 0.81 mg/dL (ref 0.61–1.24)
GFR, Estimated: >60 mL/min (ref >60)
Glucose, Bld: 106 mg/dL — ABNORMAL HIGH (ref 70–99)
Potassium: 4.2 mmol/L (ref 3.5–5.1)
Sodium: 136 mmol/L (ref 135–145)
Total Bilirubin: 0.4 mg/dL (ref 0.0–1.2)
Total Protein: 7.6 g/dL (ref 6.5–8.1)

## 2023-08-27 NOTE — Progress Notes (Signed)
 Hematology and Oncology Follow Up Visit  Mitchell Thornton [id number] 01/04/1956 68 y.o. 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 He is doing quite well and has no complaints at this time.  He plans to return to [place] in December with his children for Christmas.  Energy has been good.  No issue with infection. No fever, chills, n/v, cough, rash, dizziness, SOB, chest pain, palpitations, abdominal pain or changes in bowel or bladder habits.  No swelling, tenderness, numbness or tingling in his extremities at this time. Occasional puffiness around the ankles at the end of the day that resolves over night with elevating his feet.  No falls or syncope reported.  Appetite and hydration are good. Weight is stable at 303 lbs.   ECOG Performance Status: 1 - Symptomatic but completely ambulatory  Medications:  Allergies as of 08/27/2023   No Known Allergies      Medication List        Accurate as of August 27, 2023  8:53 AM. If you have any questions, ask your nurse or doctor.          Alphagan P 0.1 % Soln Generic drug: brimonidine Place 1 drop into both eyes 3 (three) times daily.   amLODipine  10 MG tablet Commonly known as: NORVASC  Take 1 tablet (10 mg total) by mouth daily.   atorvastatin  20 MG tablet Commonly known as: LIPITOR Take 1 tablet (20 mg total) by mouth at bedtime.   ferrous sulfate 324 MG Tbec Take 324 mg by mouth.   finasteride  5 MG tablet Commonly known as: PROSCAR  Take 1 tablet (5 mg total) by mouth daily.   folic acid  1 MG tablet Commonly known as: FOLVITE  TAKE 1 TABLET BY MOUTH EVERY DAY   latanoprost 0.005 % ophthalmic solution Commonly known as: XALATAN Place 1 drop  into both eyes at bedtime.   mesalamine 1.2 g EC tablet Commonly known as: LIALDA Take 1.2 g by mouth 2 (two) times daily. Take 2 tablet by mouth every morning.   Restasis 0.05 % ophthalmic emulsion Generic drug: cycloSPORINE Place 1 drop into both eyes 2 (two) times daily.   Tafluprost (PF) 0.0015 % Soln Apply 1 drop to eye at bedtime.   Xarelto  20 MG Tabs tablet Generic drug: rivaroxaban  TAKE 1 TABLET BY MOUTH DAILY WITH SUPPER        Allergies: No Known Allergies  Past Medical History, Surgical history, Social history, and Family History were reviewed and updated.  Review of Systems: All other 10 point review of systems is negative.   Physical Exam:  weight is 303 lb (137.4 kg) (abnormal). His oral temperature is 98.9 F (37.2 C). His blood pressure is 136/60 and his pulse is 50 (abnormal). His respiration is 17 and oxygen saturation is 100%.   Wt Readings from Last 3 Encounters:  08/27/23 (!) 303 lb (137.4 kg)  08/24/23 (!) 306 lb 6 oz (139 kg)  07/22/23 300 lb (136.1 kg)    Ocular: Sclerae unicteric, pupils equal, round and reactive to light Ear-nose-throat: Oropharynx clear, dentition fair Lymphatic: No cervical or supraclavicular adenopathy Lungs no rales or rhonchi, good excursion bilaterally Heart regular rate and rhythm, no murmur appreciated Abd soft,  nontender, positive bowel sounds MSK no focal spinal tenderness, no joint edema Neuro: non-focal, well-oriented, appropriate affect Breasts: Deferred   Lab Results  Component Value Date   WBC 5.7 08/27/2023   HGB 14.1 08/27/2023   HCT 43.2 08/27/2023   MCV 81.1 08/27/2023   PLT 196 08/27/2023   No results found for: FERRITIN, IRON, TIBC, UIBC, IRONPCTSAT Lab Results  Component Value Date   RBC 5.33 08/27/2023   No results found for: KPAFRELGTCHN, LAMBDASER, KAPLAMBRATIO No results found for: IGGSERUM, IGA, IGMSERUM No results found for: STEPHANY CARLOTA BENSON MARKEL EARLA JOANNIE DOC, MSPIKE, SPEI   Chemistry      Component Value Date/Time   NA 136 08/24/2023 0826   NA 142 01/28/2017 1447   NA 137 09/11/2016 1104   K 4.3 08/24/2023 0826   K 3.9 01/28/2017 1447   K 4.0 09/11/2016 1104   CL 103 08/24/2023 0826   CL 103 01/28/2017 1447   CO2 29 08/24/2023 0826   CO2 29 01/28/2017 1447   CO2 25 09/11/2016 1104   BUN 8 08/24/2023 0826   BUN 8 01/28/2017 1447   BUN 12.5 09/11/2016 1104   CREATININE 0.74 08/24/2023 0826   CREATININE 0.96 08/27/2022 0801   CREATININE 0.8 01/28/2017 1447   CREATININE 0.9 09/11/2016 1104      Component Value Date/Time   CALCIUM  8.8 08/24/2023 0826   CALCIUM  8.9 01/28/2017 1447   CALCIUM  9.5 09/11/2016 1104   ALKPHOS 70 08/24/2023 0826   ALKPHOS 112 (H) 01/28/2017 1447   ALKPHOS 91 09/11/2016 1104   AST 16 08/24/2023 0826   AST 19 08/27/2022 0801   AST 21 09/11/2016 1104   ALT 19 08/24/2023 0826   ALT 19 08/27/2022 0801   ALT 31 01/28/2017 1447   ALT 18 09/11/2016 1104   BILITOT 0.5 08/24/2023 0826   BILITOT 0.4 08/27/2022 0801   BILITOT 0.46 09/11/2016 1104       Impression and Plan: Mitchell Thornton is a very pleasant 68 yo African American gentleman with 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  He continues to do well and so far there has been no evidence of recurrent thrombus.  He will continue his same regimen with Xarelto  lifelong.  Follow-up in 1 year.   Lauraine Pepper, NP 7/25/20258:53 AM

## 2023-10-19 ENCOUNTER — Telehealth: Payer: Self-pay

## 2023-10-19 NOTE — Telephone Encounter (Signed)
 [facility] is needing clearance for Pt to hold Xarelto  for his cscope. Form placed in PCP red folder.

## 2023-10-19 NOTE — Telephone Encounter (Signed)
 He sees hematology, defer to them

## 2023-10-19 NOTE — Telephone Encounter (Signed)
 Form faxed back to [facility] [phone]) informing them to contact hematology.

## 2023-12-03 ENCOUNTER — Other Ambulatory Visit: Payer: Self-pay | Admitting: Hematology & Oncology

## 2024-01-03 ENCOUNTER — Ambulatory Visit
Admission: RE | Admit: 2024-01-03 | Discharge: 2024-01-03 | Disposition: A | Source: Ambulatory Visit | Attending: Family Medicine

## 2024-01-03 ENCOUNTER — Ambulatory Visit: Admitting: Family Medicine

## 2024-01-03 ENCOUNTER — Encounter: Payer: Self-pay | Admitting: Family Medicine

## 2024-01-03 VITALS — BP 136/74 | HR 82 | Temp 98.0°F | Resp 16 | Ht 73.0 in | Wt 310.0 lb

## 2024-01-03 DIAGNOSIS — R0683 Snoring: Secondary | ICD-10-CM | POA: Diagnosis not present

## 2024-01-03 DIAGNOSIS — R053 Chronic cough: Secondary | ICD-10-CM | POA: Diagnosis not present

## 2024-01-03 NOTE — Patient Instructions (Addendum)
 We will be in touch regarding your X-ray results.   Please get your X-ray done in the basement of our [place] office located on: [address] [place], [place] [address]  You do not need an appointment for that location.   If you do not hear anything about your referral in the next 1-2 weeks, call our office and ask for an update.  Please go back to using your Flonase once daily for the next 60 days.   Let us  know if you need anything.

## 2024-01-03 NOTE — Progress Notes (Signed)
 Chief Complaint  Patient presents with   Cough    Cough    Mitchell Thornton is 68 y.o. and is here for a cough.  Duration: 1 yr Productive? No Associated symptoms: wheezing (in neck region), sometimes hoarse voice Denies: fever, night sweats, nasal congestion, rhinorrhea, sore throat, chest pain, hemoptysis, dyspnea, wt loss Hx of GERD? No; no association with meals. Sometimes gets worse at night when he lays down. ACEi? No He used an INCS, was not consistently. He used it 3-4 times per week for around 2 weeks.  He is not a smoker.  Past Medical History:  Diagnosis Date   Colitis    Cscope ~ 2007, L sided, Rx lialda   Glaucoma    Family History  Problem Relation Age of Onset   Diabetes Mother    Deep vein thrombosis Mother        clots mother    Hypertension Mother    Prostate cancer Father 68   Aneurysm Father        brain aneurysm   Coronary artery disease Neg Hx    Stroke Neg Hx    Colon cancer Neg Hx    Allergies as of 01/03/2024   No Known Allergies      Medication List        Accurate as of January 03, 2024  4:30 PM. If you have any questions, ask your nurse or doctor.          Alphagan P 0.1 % Soln Generic drug: brimonidine Place 1 drop into both eyes 3 (three) times daily.   amLODipine  10 MG tablet Commonly known as: NORVASC  Take 1 tablet (10 mg total) by mouth daily.   atorvastatin  20 MG tablet Commonly known as: LIPITOR Take 1 tablet (20 mg total) by mouth at bedtime.   ferrous sulfate 324 MG Tbec Take 324 mg by mouth.   finasteride  5 MG tablet Commonly known as: PROSCAR  Take 1 tablet (5 mg total) by mouth daily.   folic acid  1 MG tablet Commonly known as: FOLVITE  TAKE 1 TABLET BY MOUTH EVERY DAY   latanoprost 0.005 % ophthalmic solution Commonly known as: XALATAN Place 1 drop into both eyes at bedtime.   mesalamine 1.2 g EC tablet Commonly known as: LIALDA Take 1.2 g by mouth 2 (two) times daily. Take 2 tablet by mouth  every morning.   Restasis 0.05 % ophthalmic emulsion Generic drug: cycloSPORINE Place 1 drop into both eyes 2 (two) times daily.   Tafluprost (PF) 0.0015 % Soln Apply 1 drop to eye at bedtime.   Xarelto  20 MG Tabs tablet Generic drug: rivaroxaban  TAKE 1 TABLET BY MOUTH DAILY WITH SUPPER        BP 136/74 (BP Location: Left Arm, Patient Position: Sitting)   Pulse 82   Temp 98 F (36.7 C) (Oral)   Resp 16   Ht 6' 1 (1.854 m)   Wt (!) 310 lb (140.6 kg)   SpO2 98%   BMI 40.90 kg/m  Gen: Awake, alert, appears stated age HEENT: Ears neg, nares patent without D/C, turbinates unremarkable, Pharynx pink without exudate Neck: Supple, no masses or asymmetry, no tenderness Heart: RRR, 2+ pitting bilateral LE edema tapering at the knees Lungs: CTAB, normal effort, no accessory muscle use Psych: Age appropriate judgement and insight, normal mood and affect  Chronic cough - Plan: DG Chest 2 View, Ambulatory referral to ENT  Snoring - Plan: Ambulatory referral to Neurology  Suspect upper airway cough syndrome.  Check a chest x-ray.  Refer to ENT for their opinion.  Also recommended consistent use of Flonase for the next 60 days.  If this workup is unremarkable and treatment is unhelpful, would have him return to discuss pulmonary function testing versus cardiac evaluation. His wife does say he snores at night.  He denies waking up gasping for air or fatigue.  No reports of witnessed apneic episodes.  Requesting sleep study. The patient voiced understanding and agreement to the plan.  Mabel [place], DO 01/03/24 4:30 PM

## 2024-01-06 ENCOUNTER — Ambulatory Visit: Payer: Self-pay | Admitting: Family Medicine

## 2024-01-07 ENCOUNTER — Other Ambulatory Visit

## 2024-01-07 ENCOUNTER — Other Ambulatory Visit: Payer: Self-pay

## 2024-01-07 DIAGNOSIS — C61 Malignant neoplasm of prostate: Secondary | ICD-10-CM

## 2024-01-08 LAB — PSA: Prostate Specific Ag, Serum: 2.4 ng/mL (ref 0.0–4.0)

## 2024-01-10 ENCOUNTER — Ambulatory Visit: Payer: Self-pay | Admitting: Urology

## 2024-01-10 ENCOUNTER — Other Ambulatory Visit

## 2024-01-12 ENCOUNTER — Ambulatory Visit: Admitting: Urology

## 2024-01-14 ENCOUNTER — Other Ambulatory Visit

## 2024-01-19 ENCOUNTER — Other Ambulatory Visit: Payer: Self-pay | Admitting: Internal Medicine

## 2024-01-21 ENCOUNTER — Ambulatory Visit: Admitting: Urology

## 2024-02-09 ENCOUNTER — Institutional Professional Consult (permissible substitution): Admitting: Neurology

## 2024-02-22 ENCOUNTER — Other Ambulatory Visit: Payer: Self-pay | Admitting: Internal Medicine

## 2024-02-25 ENCOUNTER — Ambulatory Visit: Admitting: Internal Medicine

## 2024-02-25 ENCOUNTER — Encounter: Payer: Self-pay | Admitting: Internal Medicine

## 2024-02-25 VITALS — BP 136/86 | HR 56 | Temp 98.2°F | Resp 16 | Ht 73.0 in | Wt 310.1 lb

## 2024-02-25 DIAGNOSIS — R739 Hyperglycemia, unspecified: Secondary | ICD-10-CM | POA: Diagnosis not present

## 2024-02-25 DIAGNOSIS — Z23 Encounter for immunization: Secondary | ICD-10-CM | POA: Diagnosis not present

## 2024-02-25 DIAGNOSIS — C61 Malignant neoplasm of prostate: Secondary | ICD-10-CM

## 2024-02-25 DIAGNOSIS — I10 Essential (primary) hypertension: Secondary | ICD-10-CM

## 2024-02-25 LAB — BASIC METABOLIC PANEL WITH GFR
BUN: 8 mg/dL (ref 6–23)
CO2: 28 meq/L (ref 19–32)
Calcium: 9 mg/dL (ref 8.4–10.5)
Chloride: 97 meq/L (ref 96–112)
Creatinine, Ser: 0.76 mg/dL (ref 0.40–1.50)
GFR: 93.46 mL/min
Glucose, Bld: 101 mg/dL — ABNORMAL HIGH (ref 70–99)
Potassium: 4.3 meq/L (ref 3.5–5.1)
Sodium: 132 meq/L — ABNORMAL LOW (ref 135–145)

## 2024-02-25 LAB — HEMOGLOBIN A1C: Hgb A1c MFr Bld: 6.2 % (ref 4.6–6.5)

## 2024-02-25 NOTE — Progress Notes (Signed)
 "  Subjective:    Patient ID: Mitchell Thornton, male    DOB: 30-Jul-1955, 69 y.o.   MRN: [id number]  DOS:  02/25/2024 Follow-up Discussed the use of AI scribe software for clinical note transcription with the patient, who gave verbal consent to proceed.  History of Present Illness ROV  Nasal bleeding and congestion - Nocturnal nasal bleeding and congestion developed after starting Flonase for an irritating cough - Cough has resolved - Blood present in nasal mucus when blowing nose - No blood in stools or urine - Concern about possible medication contribution to nasal bleeding  Medication use - Started Flonase prior to onset of nasal bleeding and congestion - Takes finasteride  for prostate cancer and benign prostatic hyperplasia - Takes Xarelto   Diet and weight management - Interested in improving diet and weight, inspired by wife's recent dietary changes    Review of Systems See above   Past Medical History:  Diagnosis Date   Colitis    Cscope ~ 2007, L sided, Rx lialda   Glaucoma     Past Surgical History:  Procedure Laterality Date   CYSTECTOMY     from leg     Current Outpatient Medications  Medication Instructions   ALPHAGAN P 0.1 % SOLN 1 drop, 3 times daily   amLODipine  (NORVASC ) 10 MG tablet TAKE 1 TABLET(10 MG) BY MOUTH DAILY   atorvastatin  (LIPITOR) 20 mg, Oral, Daily at bedtime   cycloSPORINE (RESTASIS) 0.05 % ophthalmic emulsion 1 drop, 2 times daily   ferrous sulfate 324 mg   finasteride  (PROSCAR ) 5 mg, Oral, Daily   folic acid  (FOLVITE ) 1 mg, Oral, Daily   latanoprost (XALATAN) 0.005 % ophthalmic solution 1 drop, Daily at bedtime   mesalamine (LIALDA) 1.2 g, 2 times daily   Tafluprost, PF, 0.0015 % SOLN 1 drop, Daily at bedtime   Xarelto  20 mg, Oral, Daily with supper       Objective:   Physical Exam BP 136/86   Pulse (!) 56   Temp 98.2 F (36.8 C) (Oral)   Resp 16   Ht 6' 1 (1.854 m)   Wt (!) 310 lb 2 oz (140.7 kg)   SpO2 98%   BMI  40.92 kg/m  General:   Well developed, NAD, BMI noted. HEENT:  Normocephalic . Face symmetric, atraumatic.  Nostrils: Dried blood anteriorly in both sides Lungs:  CTA B Normal respiratory effort, no intercostal retractions, no accessory muscle use. Heart: RRR,  no murmur.  Lower extremities: no pretibial edema bilaterally  Skin: Not pale. Not jaundice Neurologic:  alert & oriented X3.  Speech normal, gait appropriate for age and unassisted Psych--  Cognition and judgment appear intact.  Cooperative with normal attention span and concentration.  Behavior appropriate. No anxious or depressed appearing.      Assessment     Assessment Hyperglycemia -- a1c 5.9 (03-2015) HTN Dyslipidemia Prostate cancer Dx 04/2023. 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 Hemorrhoids   Assessment & Plan Epistaxis   As described above, saw ENT recently, laryngoscopy negative.  Recommend to use Vaseline at night to prevent epistaxis.[name] Reports BPs look good at home, continue amlodipine , monitor BPs,  goals provided.  Check BMP Hyperglycemia: Check A1c.  See next Ulcerative colitis: LOV GI in December 2025 Lifelong anticoagulation.  On Xarelto  Morbid obesity: His wife has changed her diet and he plans to follow the same plan with more fruits vegetables, less carbohydrates. Dyslipidemia Cholesterol levels well-controlled with atorvastatin . Last LDL was 43. Prostate cancer On finasteride .  Per urology Preventive care: Flu shot today, encouraged to think about a COVID-vaccine. RTC 6 months HPI     "

## 2024-02-25 NOTE — Patient Instructions (Signed)
 Please read your instructions carefully.   GO TO THE LAB :  Get the blood work    Go to the front desk for the checkout Please make an appointment for a physical exam in 6 months   You are a flu shot today Consider a COVID-vaccine  Check your blood pressure regularly blood pressure regularly Blood pressure goal:  between 110/65 and  130/80. If it is consistently higher or lower, let me know  Use   Vaseline in your nose every night

## 2024-02-26 NOTE — Assessment & Plan Note (Signed)
 Epistaxis   As described above, saw ENT recently, laryngoscopy negative.  Recommend to use Vaseline at night to prevent epistaxis.[name] Reports BPs look good at home, continue amlodipine , monitor BPs, goals provided.  Check BMP Hyperglycemia: Check A1c.  See next Ulcerative colitis: LOV GI in December 2025 Lifelong anticoagulation.  On Xarelto  Morbid obesity: His wife has changed her diet and he plans to follow the same plan with more fruits vegetables, less carbohydrates. Dyslipidemia Cholesterol levels well-controlled with atorvastatin . Last LDL was 43. Prostate cancer On finasteride .  Per urology Preventive care: Flu shot today, encouraged to think about a COVID-vaccine. RTC 6 months HPI

## 2024-02-28 ENCOUNTER — Ambulatory Visit: Payer: Self-pay | Admitting: Internal Medicine

## 2024-02-29 ENCOUNTER — Ambulatory Visit: Admitting: Urology

## 2024-02-29 ENCOUNTER — Encounter: Payer: Self-pay | Admitting: Urology

## 2024-02-29 VITALS — BP 152/74 | HR 60 | Ht 72.0 in | Wt 310.0 lb

## 2024-02-29 DIAGNOSIS — N401 Enlarged prostate with lower urinary tract symptoms: Secondary | ICD-10-CM | POA: Diagnosis not present

## 2024-02-29 DIAGNOSIS — C61 Malignant neoplasm of prostate: Secondary | ICD-10-CM | POA: Diagnosis not present

## 2024-02-29 DIAGNOSIS — N138 Other obstructive and reflux uropathy: Secondary | ICD-10-CM | POA: Diagnosis not present

## 2024-02-29 LAB — URINALYSIS, ROUTINE W REFLEX MICROSCOPIC
Bilirubin, UA: NEGATIVE
Glucose, UA: NEGATIVE
Ketones, UA: NEGATIVE
Leukocytes,UA: NEGATIVE
Nitrite, UA: NEGATIVE
Protein,UA: NEGATIVE
RBC, UA: NEGATIVE
Specific Gravity, UA: 1.015 (ref 1.005–1.030)
Urobilinogen, Ur: 0.2 mg/dL (ref 0.2–1.0)
pH, UA: 7 (ref 5.0–7.5)

## 2024-02-29 MED ORDER — FINASTERIDE 5 MG PO TABS: 5.0000 mg | ORAL_TABLET | Freq: Every day | ORAL | 3 refills | Status: AC

## 2024-02-29 NOTE — Progress Notes (Signed)
 "  Assessment: 1. Prostate cancer (HCC); GG 1 on bx 3/25; low risk on active surveillance   2. Benign prostatic hyperplasia with urinary obstruction     Plan: Continue finasteride  5 mg daily.  I discussed the role of active surveillance for management of low risk and favorable intermediate risk prostate cancer. The advantages of active surveillance were discussed including the following:  - Somewhere between 50% and 68% of eligible patients may safely avoid treatment for at least 10 years - Patient's will avoid possible side effects of definitive therapy that may be unnecessary - Quality of life/normal activities will be less affected while on active surveillance - Risk of unnecessary treatment of small, indolent cancers will be reduced  Limitations of active surveillance discussed including: -Between 32% and 50% of patients will require treatment by 10 years, although treatment delays do not seem to impact cure rate -Although the risk is very low (<0.5% according to most published series), it is possible for cancers progressed to a regional or metastatic stage while on active surveillance  I also discussed that patients who choose active surveillance should have regular follow-up including PSA testing approximately every 6 months, DRE annually, confirmatory biopsy approximately 1 year after initial diagnosis, multiparametric MRI every 12 months or as clinically indicated.  Recommend further evaluation with prostate MRI. Will contact him with results and to arrange follow-up.  Chief Complaint: Chief Complaint  Patient presents with   Prostate Cancer    HPI: Mitchell Thornton is a 69 y.o. male who presents for continued evaluation of low risk prostate cancer and BPH with lower urinary tract symptoms. He was previously followed by Dr. Shona.  Prostate Cancer: He was evaluated for elevated PSA. PSA data: 07/2017             1.53 07/2018             1.56 07/2019              2.20 07/2020             2.41 08/2021             3.58 08/2022             4.73 11/2022 5.75 12/29/2022  prostate MRI--IMPRESSION: 1. PI-RADS category 3 lesion of the right posterolateral peripheral zone in the mid gland. Targeting data sent to [facility]. 2. Prostatomegaly and benign prostatic hypertrophy.  97ml volume 3. 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 04/2023             MRI fusion biopsy-single core from the left lateral apex with Gleason 3+3 = 6 adenocarcinoma with 18% core involvement.  All of the biopsies including targeted biopsy were negative. Decipher test Low risk group 07/2023  3.3 12/25  2.4  BPH with LUTS: Patient reports minimal lower urinary tract symptoms.  IPSS = 6 Patient denies erectile dysfunction  He was started on finasteride  5 mg daily for his large volume BPH with LUTS.  Patient has a past medical history of obesity, hypertension, ulcerative colitis, and hypercoagulable state with history of DVT on Xarelto .    At his visit in June 2025, he was doing well.  He continued on finasteride  5 mg daily and was tolerating the medication.  His lower urinary tract symptoms were fairly stable.  He reported some frequency and nocturia.  No dysuria or gross hematuria. IPSS = 15/2.  He returns today  for follow-up.  He continues on finasteride .  His lower urinary tract symptoms are stable.  No dysuria or gross hematuria. IPSS = 8/2.   Portions of the above documentation were copied from a prior visit for review purposes only.  Allergies: No Known Allergies  PMH: Past Medical History:  Diagnosis Date   Colitis    Cscope ~ 2007, L sided, Rx lialda   Glaucoma     PSH: Past Surgical History:  Procedure Laterality Date   CYSTECTOMY     from leg     SH: Social History   Tobacco Use   Smoking status: Never   Smokeless tobacco: Never   Tobacco comments:    NEVER  USED TOBACCO  Vaping Use   Vaping status: Never Used  Substance Use Topics   Alcohol use: No    Alcohol/week: 0.0 standard drinks of alcohol   Drug use: No    ROS: Constitutional:  Negative for fever, chills, weight loss CV: Negative for chest pain, previous MI, hypertension Respiratory:  Negative for shortness of breath, wheezing, sleep apnea, frequent cough GI:  Negative for nausea, vomiting, bloody stool, GERD  PE: BP (!) 152/74   Pulse 60   Ht 6' (1.829 m)   Wt (!) 310 lb (140.6 kg)   BMI 42.04 kg/m  GENERAL APPEARANCE:  Well appearing, well developed, well nourished, NAD HEENT:  Atraumatic, normocephalic, oropharynx clear NECK:  Supple without lymphadenopathy or thyromegaly ABDOMEN:  Soft, non-tender, no masses EXTREMITIES:  Moves all extremities well, without clubbing, cyanosis, or edema NEUROLOGIC:  Alert and oriented x 3, normal gait, CN II-XII grossly intact MENTAL STATUS:  appropriate BACK:  Non-tender to palpation, No CVAT SKIN:  Warm, dry, and intact  Results: U/A: Negative "

## 2024-03-02 ENCOUNTER — Encounter: Payer: Self-pay | Admitting: Neurology

## 2024-03-02 ENCOUNTER — Ambulatory Visit (INDEPENDENT_AMBULATORY_CARE_PROVIDER_SITE_OTHER): Admitting: Neurology

## 2024-03-02 ENCOUNTER — Other Ambulatory Visit: Payer: Self-pay | Admitting: Hematology & Oncology

## 2024-03-02 ENCOUNTER — Telehealth: Payer: Self-pay | Admitting: Neurology

## 2024-03-02 VITALS — BP 143/62 | HR 66 | Ht 72.0 in | Wt 316.6 lb

## 2024-03-02 DIAGNOSIS — R0683 Snoring: Secondary | ICD-10-CM

## 2024-03-02 DIAGNOSIS — R0681 Apnea, not elsewhere classified: Secondary | ICD-10-CM

## 2024-03-02 DIAGNOSIS — Z820 Family history of epilepsy and other diseases of the nervous system: Secondary | ICD-10-CM

## 2024-03-02 DIAGNOSIS — R351 Nocturia: Secondary | ICD-10-CM | POA: Diagnosis not present

## 2024-03-02 DIAGNOSIS — Z9189 Other specified personal risk factors, not elsewhere classified: Secondary | ICD-10-CM | POA: Diagnosis not present

## 2024-03-02 NOTE — Telephone Encounter (Signed)
 Patient called to check appointment time because he said he thought it was 2:30 pm. Informed patient appointment is at 2:15pm. Patient would try to make it for appointment. Informed him if late may have to reschedule and pay no show fee. Patient verbalized understand.

## 2024-03-02 NOTE — Progress Notes (Signed)
 Subjective:    Patient ID: Mitchell Thornton is a 69 y.o. male.  HPI    True Mar, MD, PhD [facility] [address], [address] [address] [place], [place] [address]  Dear Dr. Frann,   I saw your patient, Mitchell Thornton, upon your kind request in my sleep clinic today for initial consultation of his sleep disorder, in particular, concern for underlying obstructive sleep apnea.  The patient is unaccompanied today.  As you know, Mitchell Thornton is a 69 year old male with an underlying medical history of colitis, history of blood clots, BPH, low-grade prostate cancer under surveillance, HLP, HTN, glaucoma, chronic cough and severe obesity with a BMI of over 40, who reports snoring and excessive daytime somnolence as well as apneic pauses while asleep per wife's report.  His Epworth sleepiness score is 4 out of 24, fatigue severity score is 13 out of 63.  I reviewed your office note from 01/03/2024.   Generally, he is in bed between 10 and MN, and rise time is around 5 AM.  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  His wife is retired, he lives with his wife and his grown daughter.  He has 2 sons in [place].  Patient is the youngest of a total of 8 siblings.  Two of his sisters have sleep apnea.  He denies recurrent morning headaches or nocturnal headaches.  He has nocturia about twice per average night.  He drinks caffeine in the form of coffee, usually 1 cup/day.  He is a non-smoker and does not drink alcohol on a regular basis, rarely.  He is working on weight loss, weight has been more or less stable, increased a little bit over the holidays as he went to [place].   His Past Medical History Is Significant For: Past Medical History:  Diagnosis Date   Colitis    Cscope ~ 2007, L sided, Rx lialda   Glaucoma     His Past Surgical History Is Significant For: Past Surgical History:  Procedure Laterality Date   CYSTECTOMY     from leg     His Family History  Is Significant For: Family History  Problem Relation Age of Onset   Diabetes Mother    Deep vein thrombosis Mother        clots mother    Hypertension Mother    Prostate cancer Father 36   Aneurysm Father        brain aneurysm   Coronary artery disease Neg Hx    Stroke Neg Hx    Colon cancer Neg Hx     His Social History Is Significant For: Social History   Socioeconomic History   Marital status: Married    Spouse name: Not on file   Number of children: 3   Years of education: Not on file   Highest education level: Not on file  Occupational History   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   Tobacco Use   Smoking status: Never   Smokeless tobacco: Never   Tobacco comments:    NEVER USED TOBACCO  Vaping Use   Vaping status: Never Used  Substance and Sexual Activity   Alcohol use: No    Alcohol/week: 0.0 standard drinks of alcohol   Drug use: No   Sexual activity: Not on file  Other Topics Concern   Not on file  Social History Narrative   Original from [place], moved to the [place]  in [place], moved to [place] ~ 1995  Household- pt, wife    Social Drivers of Health   Tobacco Use: Low Risk (03/02/2024)   Patient History    Smoking Tobacco Use: Never    Smokeless Tobacco Use: Never    Passive Exposure: Not on file  Financial Resource Strain: Not on file  Food Insecurity: Not on file  Transportation Needs: Not on file  Physical Activity: Not on file  Stress: Not on file  Social Connections: Not on file  Depression (PHQ2-9): Low Risk (02/25/2024)   Depression (PHQ2-9)    PHQ-2 Score: 0  Alcohol Screen: Not on file  Housing: Not on file  Utilities: Not on file  Health Literacy: Not on file    His Allergies Are:  Allergies[1]:   His Current Medications Are:  Outpatient Encounter Medications as of 03/02/2024  Medication Sig   ALPHAGAN P 0.1 % SOLN Place 1 drop into both eyes 3 (three) times daily.   amLODipine  (NORVASC ) 10 MG tablet TAKE 1 TABLET(10 MG) BY MOUTH DAILY    atorvastatin  (LIPITOR) 20 MG tablet Take 1 tablet (20 mg total) by mouth at bedtime.   cycloSPORINE (RESTASIS) 0.05 % ophthalmic emulsion Place 1 drop into both eyes 2 (two) times daily.   ferrous sulfate 324 MG TBEC Take 324 mg by mouth.   finasteride  (PROSCAR ) 5 MG tablet Take 1 tablet (5 mg total) by mouth daily.   folic acid  (FOLVITE ) 1 MG tablet TAKE 1 TABLET BY MOUTH EVERY DAY   latanoprost (XALATAN) 0.005 % ophthalmic solution Place 1 drop into both eyes at bedtime.     mesalamine (LIALDA) 1.2 G EC tablet Take 1.2 g by mouth 2 (two) times daily. Take 2 tablet by mouth every morning.   Tafluprost, PF, 0.0015 % SOLN Apply 1 drop to eye at bedtime.   XARELTO  20 MG TABS tablet TAKE 1 TABLET BY MOUTH DAILY WITH SUPPER   No facility-administered encounter medications on file as of 03/02/2024.  :   Review of Systems:  Out of a complete 14 point review of systems, all are reviewed and negative with the exception of these symptoms as listed below:   Review of Systems  Objective:  Neurological Exam  Physical Exam Physical Examination:   Vitals:   03/02/24 1427  BP: (!) 143/62  Pulse: 66    General Examination: The patient is a very pleasant 69 y.o. male in no acute distress. He appears well-developed and well-nourished and well groomed.   HEENT: Normocephalic, atraumatic, pupils are equal, round and reactive to light, extraocular tracking is good without limitation to gaze excursion or nystagmus noted. No photophobia.  no Corrective eye glasses in place. Hearing is grossly intact.  Face is symmetric with normal facial animation. Speech is clear without dysarthria. There is no hypophonia. There is no lip, neck/head, jaw or voice tremor. Neck is supple with full range of passive and active motion. There are no carotid bruits on auscultation.  Airway/Oropharynx exam reveals: mild mouth dryness, adequate dental hygiene and moderate airway crowding, due to wider tongue and larger  uvula, tonsils on the smaller side, Mallampati class III.  Minimal overbite noted.  Tongue protrudes centrally and palate elevates symmetrically.  Chest: Clear to auscultation without wheezing, rhonchi or crackles noted.  Heart: S1+S2+0, regular and normal without murmurs, rubs or gallops noted.   Abdomen: Soft, non-tender and non-distended.  Extremities: There is trace pitting edema in the distal lower extremities bilaterally.   Skin: Warm and dry without trophic changes noted.   Musculoskeletal: exam  reveals no obvious joint deformities.   Neurologically:  Mental status: The patient is awake, alert and oriented in all 4 spheres. His immediate and remote memory, attention, language skills and fund of knowledge are appropriate. There is no evidence of aphasia, agnosia, apraxia or anomia. Speech is clear with normal prosody and enunciation. Thought process is linear. Mood is normal and affect is normal.  Cranial nerves II - XII are as described above under HEENT exam.  Motor exam: Normal bulk, moving all 4 extremities without restriction, no obvious action or resting tremor.  Fine motor skills and coordination: Intact grossly.  Cerebellar testing: No dysmetria or intention tremor. There is no truncal or gait ataxia.  Sensory exam: intact to light touch in the upper and lower extremities.  Gait, station and balance: He stands without difficulty, he walks without a walking aid.  He does walk slowly, has a mildly increased lumbar kyphosis.  Assessment and Plan:   In summary, Mitchell Thornton is a 68 year old male with an underlying medical history of colitis, history of blood clots, BPH, low-grade prostate cancer under surveillance, HLP, HTN, glaucoma, chronic cough and severe obesity with a BMI of over 40, whose history and physical exam are concerning for sleep disordered breathing, particularly obstructive sleep apnea (OSA). A laboratory attended sleep study is typically considered gold  standard for evaluation of sleep disordered breathing.   I had a long chat with the patient about my findings and the diagnosis of sleep apnea , particularly OSA, its prognosis and treatment options. We talked about medical/conservative treatments, surgical interventions and non-pharmacological approaches for symptom control. I explained, in particular, the risks and ramifications of untreated moderate to severe OSA, especially with respect to developing cardiovascular disease down the road, including congestive heart failure (CHF), difficult to treat hypertension, cardiac arrhythmias (particularly A-fib), neurovascular complications including TIA, stroke and dementia. Even type 2 diabetes has, in part, been linked to untreated OSA. Symptoms of untreated OSA may include (but may not be limited to) daytime sleepiness, nocturia (i.e. frequent nighttime urination), memory problems, mood irritability and suboptimally controlled or worsening mood disorder such as depression and/or anxiety, lack of energy, lack of motivation, physical discomfort, as well as recurrent headaches, especially morning or nocturnal headaches. We talked about the importance of maintaining a healthy lifestyle and striving for healthy weight. In addition, we talked about the importance of striving for and maintaining good sleep hygiene, including making enough time for sleep. I recommended a sleep study at this time. I outlined the differences between a laboratory attended sleep study which is considered more comprehensive and accurate over the option of a home sleep test (HST); the latter may lead to underestimation of sleep disordered breathing in some instances and does not help with diagnosing upper airway resistance syndrome and is not accurate enough to diagnose primary central sleep apnea typically. I outlined possible surgical and non-surgical treatment options of OSA, including the use of a positive airway pressure (PAP) device (i.e.  CPAP, AutoPAP/APAP or BiPAP in certain circumstances), a custom-made dental device (aka oral appliance, which would require a referral to a specialist dentist or orthodontist typically, and is generally speaking not considered for patients with full dentures or edentulous state), upper airway surgical options, such as traditional UPPP (which is not considered a first-line treatment) or the Inspire device (hypoglossal nerve stimulator, which would involve a referral for consultation with an ENT surgeon, after careful selection, following inclusion criteria - also not first-line treatment). I explained the PAP  treatment option to the patient in detail, as this is generally considered first-line treatment.  The patient indicated that he would be willing to try PAP therapy, if the need arises. I explained the importance of being compliant with PAP treatment, not only for insurance purposes but primarily to improve patient's symptoms symptoms, and for the patient's long term health benefit, including to reduce His cardiovascular risks longer-term.    We will pick up our discussion about the next steps and treatment options after testing.  We will keep him posted as to the test results by phone call and/or MyChart messaging where possible.  We will plan to follow-up in sleep clinic accordingly as well.  I answered all his questions today and the patient was in agreement.   I encouraged him to call with any interim questions, concerns, problems or updates or email us  through MyChart.  Generally speaking, sleep test authorizations may take up to 2 weeks, sometimes less, sometimes longer, the patient is encouraged to get in touch with us  if they do not hear back from the sleep lab staff directly within the next 2 weeks.  Thank you very much for allowing me to participate in the care of this nice patient. If I can be of any further assistance to you please do not hesitate to call me at  [phone].  Sincerely,   True Mar, MD, PhD     [1] No Known Allergies

## 2024-03-14 ENCOUNTER — Ambulatory Visit (HOSPITAL_BASED_OUTPATIENT_CLINIC_OR_DEPARTMENT_OTHER)

## 2024-08-25 ENCOUNTER — Inpatient Hospital Stay

## 2024-08-25 ENCOUNTER — Ambulatory Visit: Admitting: Family

## 2024-08-28 ENCOUNTER — Encounter: Admitting: Internal Medicine
